# Patient Record
Sex: Female | Born: 1980
Health system: Southern US, Community
[De-identification: ages and names within clinical notes are randomized; demographics above are authoritative.]

## PROBLEM LIST (undated history)

## (undated) DIAGNOSIS — D509 Iron deficiency anemia, unspecified: Principal | ICD-10-CM

## (undated) DIAGNOSIS — R011 Cardiac murmur, unspecified: Secondary | ICD-10-CM

## (undated) DIAGNOSIS — F419 Anxiety disorder, unspecified: Secondary | ICD-10-CM

## (undated) DIAGNOSIS — M1990 Unspecified osteoarthritis, unspecified site: Secondary | ICD-10-CM

## (undated) DIAGNOSIS — I341 Nonrheumatic mitral (valve) prolapse: Secondary | ICD-10-CM

## (undated) DIAGNOSIS — D561 Beta thalassemia: Secondary | ICD-10-CM

## (undated) DIAGNOSIS — I10 Essential (primary) hypertension: Secondary | ICD-10-CM

## (undated) DIAGNOSIS — I340 Nonrheumatic mitral (valve) insufficiency: Secondary | ICD-10-CM

## (undated) DIAGNOSIS — D569 Thalassemia, unspecified: Secondary | ICD-10-CM

## (undated) DIAGNOSIS — N200 Calculus of kidney: Secondary | ICD-10-CM

## (undated) DIAGNOSIS — D649 Anemia, unspecified: Secondary | ICD-10-CM

## (undated) HISTORY — PX: KNEE SURGERY: SHX244

## (undated) HISTORY — PX: TONSILLECTOMY: SHX5217

## (undated) HISTORY — DX: Iron deficiency anemia, unspecified: D50.9

## (undated) HISTORY — DX: Cardiac murmur, unspecified: R01.1

## (undated) HISTORY — DX: Anemia, unspecified: D64.9

## (undated) HISTORY — DX: Calculus of kidney: N20.0

## (undated) HISTORY — DX: Nonrheumatic mitral (valve) prolapse: I34.1

## (undated) HISTORY — DX: Beta thalassemia: D56.1

## (undated) HISTORY — DX: Essential (primary) hypertension: I10

## (undated) HISTORY — DX: Unspecified osteoarthritis, unspecified site: M19.90

## (undated) HISTORY — PX: OVARIAN CYST REMOVAL: SHX89

## (undated) HISTORY — PX: TUBAL LIGATION: SHX77

## (undated) HISTORY — DX: Nonrheumatic mitral (valve) insufficiency: I34.0

## (undated) HISTORY — DX: Thalassemia, unspecified: D56.9

## (undated) HISTORY — DX: Anxiety disorder, unspecified: F41.9

---

## 1999-10-05 ENCOUNTER — Inpatient Hospital Stay (HOSPITAL_COMMUNITY): Admission: AD | Admit: 1999-10-05 | Discharge: 1999-10-07 | Payer: Self-pay | Admitting: *Deleted

## 1999-10-06 ENCOUNTER — Encounter: Payer: Self-pay | Admitting: *Deleted

## 1999-10-10 ENCOUNTER — Encounter: Payer: Self-pay | Admitting: *Deleted

## 1999-10-10 ENCOUNTER — Ambulatory Visit (HOSPITAL_COMMUNITY): Admission: RE | Admit: 1999-10-10 | Discharge: 1999-10-10 | Payer: Self-pay | Admitting: *Deleted

## 1999-10-11 ENCOUNTER — Encounter: Admission: RE | Admit: 1999-10-11 | Discharge: 1999-10-11 | Payer: Self-pay | Admitting: Obstetrics

## 1999-10-19 ENCOUNTER — Encounter: Admission: RE | Admit: 1999-10-19 | Discharge: 1999-10-19 | Payer: Self-pay | Admitting: Obstetrics

## 1999-10-26 ENCOUNTER — Encounter: Admission: RE | Admit: 1999-10-26 | Discharge: 1999-10-26 | Payer: Self-pay | Admitting: Obstetrics

## 1999-10-31 ENCOUNTER — Inpatient Hospital Stay (HOSPITAL_COMMUNITY): Admission: AD | Admit: 1999-10-31 | Discharge: 1999-10-31 | Payer: Self-pay | Admitting: Obstetrics

## 1999-11-01 ENCOUNTER — Encounter: Payer: Self-pay | Admitting: *Deleted

## 1999-11-01 ENCOUNTER — Inpatient Hospital Stay (HOSPITAL_COMMUNITY): Admission: AD | Admit: 1999-11-01 | Discharge: 1999-11-04 | Payer: Self-pay | Admitting: *Deleted

## 1999-11-09 ENCOUNTER — Encounter: Admission: RE | Admit: 1999-11-09 | Discharge: 1999-11-09 | Payer: Self-pay | Admitting: Obstetrics

## 1999-11-16 ENCOUNTER — Encounter: Admission: RE | Admit: 1999-11-16 | Discharge: 1999-11-16 | Payer: Self-pay | Admitting: Obstetrics

## 1999-11-23 ENCOUNTER — Encounter: Admission: RE | Admit: 1999-11-23 | Discharge: 1999-11-23 | Payer: Self-pay | Admitting: Obstetrics

## 1999-11-30 ENCOUNTER — Encounter: Admission: RE | Admit: 1999-11-30 | Discharge: 1999-11-30 | Payer: Self-pay | Admitting: Obstetrics

## 1999-12-01 ENCOUNTER — Inpatient Hospital Stay (HOSPITAL_COMMUNITY): Admission: AD | Admit: 1999-12-01 | Discharge: 1999-12-03 | Payer: Self-pay | Admitting: *Deleted

## 2000-08-13 ENCOUNTER — Encounter: Payer: Self-pay | Admitting: Emergency Medicine

## 2000-08-13 ENCOUNTER — Emergency Department (HOSPITAL_COMMUNITY): Admission: EM | Admit: 2000-08-13 | Discharge: 2000-08-14 | Payer: Self-pay | Admitting: Emergency Medicine

## 2000-08-14 ENCOUNTER — Encounter: Payer: Self-pay | Admitting: Emergency Medicine

## 2000-10-26 ENCOUNTER — Encounter: Payer: Self-pay | Admitting: Emergency Medicine

## 2000-10-26 ENCOUNTER — Emergency Department (HOSPITAL_COMMUNITY): Admission: EM | Admit: 2000-10-26 | Discharge: 2000-10-26 | Payer: Self-pay | Admitting: Emergency Medicine

## 2000-10-29 ENCOUNTER — Ambulatory Visit (HOSPITAL_BASED_OUTPATIENT_CLINIC_OR_DEPARTMENT_OTHER): Admission: RE | Admit: 2000-10-29 | Discharge: 2000-10-30 | Payer: Self-pay | Admitting: Orthopedic Surgery

## 2001-06-25 ENCOUNTER — Encounter: Admission: RE | Admit: 2001-06-25 | Discharge: 2001-08-11 | Payer: Self-pay | Admitting: Orthopedic Surgery

## 2002-05-09 ENCOUNTER — Emergency Department (HOSPITAL_COMMUNITY): Admission: EM | Admit: 2002-05-09 | Discharge: 2002-05-09 | Payer: Self-pay | Admitting: Emergency Medicine

## 2002-08-06 ENCOUNTER — Emergency Department (HOSPITAL_COMMUNITY): Admission: EM | Admit: 2002-08-06 | Discharge: 2002-08-06 | Payer: Self-pay | Admitting: *Deleted

## 2002-09-11 ENCOUNTER — Other Ambulatory Visit: Admission: RE | Admit: 2002-09-11 | Discharge: 2002-09-11 | Payer: Self-pay | Admitting: Oncology

## 2002-09-23 ENCOUNTER — Encounter: Payer: Self-pay | Admitting: Oncology

## 2002-09-23 ENCOUNTER — Observation Stay (HOSPITAL_COMMUNITY): Admission: EM | Admit: 2002-09-23 | Discharge: 2002-09-24 | Payer: Self-pay | Admitting: Oncology

## 2002-09-24 ENCOUNTER — Encounter: Payer: Self-pay | Admitting: Hematology and Oncology

## 2002-11-05 ENCOUNTER — Encounter: Payer: Self-pay | Admitting: Emergency Medicine

## 2002-11-05 ENCOUNTER — Inpatient Hospital Stay (HOSPITAL_COMMUNITY): Admission: EM | Admit: 2002-11-05 | Discharge: 2002-11-05 | Payer: Self-pay | Admitting: Emergency Medicine

## 2002-11-06 ENCOUNTER — Inpatient Hospital Stay (HOSPITAL_COMMUNITY): Admission: EM | Admit: 2002-11-06 | Discharge: 2002-11-08 | Payer: Self-pay | Admitting: Emergency Medicine

## 2002-11-06 ENCOUNTER — Encounter: Payer: Self-pay | Admitting: Internal Medicine

## 2002-12-04 ENCOUNTER — Ambulatory Visit (HOSPITAL_COMMUNITY): Admission: RE | Admit: 2002-12-04 | Discharge: 2002-12-04 | Payer: Self-pay | Admitting: Obstetrics & Gynecology

## 2002-12-04 ENCOUNTER — Encounter: Payer: Self-pay | Admitting: Obstetrics & Gynecology

## 2003-10-18 ENCOUNTER — Emergency Department (HOSPITAL_COMMUNITY): Admission: EM | Admit: 2003-10-18 | Discharge: 2003-10-18 | Payer: Self-pay | Admitting: Emergency Medicine

## 2004-06-23 ENCOUNTER — Emergency Department (HOSPITAL_COMMUNITY): Admission: EM | Admit: 2004-06-23 | Discharge: 2004-06-24 | Payer: Self-pay | Admitting: Emergency Medicine

## 2004-07-13 ENCOUNTER — Ambulatory Visit (HOSPITAL_COMMUNITY): Admission: RE | Admit: 2004-07-13 | Discharge: 2004-07-13 | Payer: Self-pay | Admitting: Urology

## 2004-08-24 ENCOUNTER — Ambulatory Visit: Payer: Self-pay | Admitting: Oncology

## 2004-09-05 ENCOUNTER — Inpatient Hospital Stay (HOSPITAL_COMMUNITY): Admission: AD | Admit: 2004-09-05 | Discharge: 2004-09-05 | Payer: Self-pay | Admitting: Obstetrics

## 2004-10-07 ENCOUNTER — Inpatient Hospital Stay (HOSPITAL_COMMUNITY): Admission: AD | Admit: 2004-10-07 | Discharge: 2004-10-07 | Payer: Self-pay | Admitting: Obstetrics

## 2004-11-14 ENCOUNTER — Inpatient Hospital Stay (HOSPITAL_COMMUNITY): Admission: AD | Admit: 2004-11-14 | Discharge: 2004-11-18 | Payer: Self-pay | Admitting: Obstetrics

## 2004-12-02 ENCOUNTER — Inpatient Hospital Stay (HOSPITAL_COMMUNITY): Admission: AD | Admit: 2004-12-02 | Discharge: 2004-12-06 | Payer: Self-pay | Admitting: Obstetrics & Gynecology

## 2004-12-02 ENCOUNTER — Encounter (INDEPENDENT_AMBULATORY_CARE_PROVIDER_SITE_OTHER): Payer: Self-pay | Admitting: *Deleted

## 2005-01-05 ENCOUNTER — Ambulatory Visit: Payer: Self-pay | Admitting: Oncology

## 2005-02-18 ENCOUNTER — Emergency Department (HOSPITAL_COMMUNITY): Admission: EM | Admit: 2005-02-18 | Discharge: 2005-02-18 | Payer: Self-pay | Admitting: Emergency Medicine

## 2005-02-20 ENCOUNTER — Ambulatory Visit: Payer: Self-pay | Admitting: Oncology

## 2005-02-23 ENCOUNTER — Inpatient Hospital Stay (HOSPITAL_COMMUNITY): Admission: AD | Admit: 2005-02-23 | Discharge: 2005-02-25 | Payer: Self-pay | Admitting: Obstetrics

## 2005-02-23 ENCOUNTER — Ambulatory Visit: Payer: Self-pay | Admitting: *Deleted

## 2005-02-28 ENCOUNTER — Inpatient Hospital Stay (HOSPITAL_COMMUNITY): Admission: AD | Admit: 2005-02-28 | Discharge: 2005-03-01 | Payer: Self-pay | Admitting: Obstetrics & Gynecology

## 2005-03-06 ENCOUNTER — Inpatient Hospital Stay (HOSPITAL_COMMUNITY): Admission: AD | Admit: 2005-03-06 | Discharge: 2005-03-09 | Payer: Self-pay | Admitting: Obstetrics

## 2005-03-07 ENCOUNTER — Encounter (INDEPENDENT_AMBULATORY_CARE_PROVIDER_SITE_OTHER): Payer: Self-pay | Admitting: Specialist

## 2005-03-20 ENCOUNTER — Ambulatory Visit: Admission: RE | Admit: 2005-03-20 | Discharge: 2005-03-20 | Payer: Self-pay | Admitting: Obstetrics

## 2005-04-24 ENCOUNTER — Ambulatory Visit: Payer: Self-pay | Admitting: Oncology

## 2005-08-14 ENCOUNTER — Ambulatory Visit: Payer: Self-pay | Admitting: Oncology

## 2005-08-16 LAB — CBC WITH DIFFERENTIAL/PLATELET
Basophils Absolute: 0 10*3/uL (ref 0.0–0.1)
Eosinophils Absolute: 0.2 10*3/uL (ref 0.0–0.5)
HCT: 33.9 % — ABNORMAL LOW (ref 34.8–46.6)
HGB: 10.8 g/dL — ABNORMAL LOW (ref 11.6–15.9)
MCV: 70.3 fL — ABNORMAL LOW (ref 81.0–101.0)
NEUT#: 2.4 10*3/uL (ref 1.5–6.5)
RDW: 15.8 % — ABNORMAL HIGH (ref 11.3–14.5)
lymph#: 1.7 10*3/uL (ref 0.9–3.3)

## 2005-11-05 ENCOUNTER — Ambulatory Visit: Payer: Self-pay | Admitting: Oncology

## 2005-11-08 LAB — CBC WITH DIFFERENTIAL/PLATELET
BASO%: 0.7 % (ref 0.0–2.0)
Eosinophils Absolute: 0.2 10*3/uL (ref 0.0–0.5)
MCHC: 32.6 g/dL (ref 32.0–36.0)
MONO#: 0.5 10*3/uL (ref 0.1–0.9)
NEUT#: 2.6 10*3/uL (ref 1.5–6.5)
RBC: 5.19 10*6/uL (ref 3.70–5.32)
RDW: 14.3 % (ref 11.3–14.5)
WBC: 5.1 10*3/uL (ref 3.9–10.0)
lymph#: 1.9 10*3/uL (ref 0.9–3.3)

## 2005-11-09 LAB — COMPREHENSIVE METABOLIC PANEL
ALT: 13 U/L (ref 0–40)
Albumin: 4.8 g/dL (ref 3.5–5.2)
CO2: 27 mEq/L (ref 19–32)
Calcium: 9.4 mg/dL (ref 8.4–10.5)
Chloride: 103 mEq/L (ref 96–112)
Glucose, Bld: 67 mg/dL — ABNORMAL LOW (ref 70–99)
Potassium: 3.9 mEq/L (ref 3.5–5.3)
Sodium: 139 mEq/L (ref 135–145)
Total Bilirubin: 1 mg/dL (ref 0.3–1.2)
Total Protein: 8.3 g/dL (ref 6.0–8.3)

## 2005-11-09 LAB — FERRITIN: Ferritin: 28 ng/mL (ref 10–291)

## 2005-11-09 LAB — PREGNANCY, URINE: Preg Test, Ur: NEGATIVE

## 2006-07-02 ENCOUNTER — Encounter: Admission: RE | Admit: 2006-07-02 | Discharge: 2006-07-02 | Payer: Self-pay | Admitting: *Deleted

## 2006-07-21 ENCOUNTER — Emergency Department (HOSPITAL_COMMUNITY): Admission: EM | Admit: 2006-07-21 | Discharge: 2006-07-21 | Payer: Self-pay | Admitting: Emergency Medicine

## 2007-02-20 ENCOUNTER — Emergency Department (HOSPITAL_COMMUNITY): Admission: EM | Admit: 2007-02-20 | Discharge: 2007-02-20 | Payer: Self-pay | Admitting: Emergency Medicine

## 2007-03-03 ENCOUNTER — Encounter: Admission: RE | Admit: 2007-03-03 | Discharge: 2007-03-03 | Payer: Self-pay | Admitting: Family Medicine

## 2007-03-27 ENCOUNTER — Inpatient Hospital Stay (HOSPITAL_COMMUNITY): Admission: RE | Admit: 2007-03-27 | Discharge: 2007-03-28 | Payer: Self-pay | Admitting: Urology

## 2007-03-31 ENCOUNTER — Ambulatory Visit (HOSPITAL_COMMUNITY): Admission: RE | Admit: 2007-03-31 | Discharge: 2007-03-31 | Payer: Self-pay | Admitting: Urology

## 2007-11-02 ENCOUNTER — Emergency Department (HOSPITAL_COMMUNITY): Admission: EM | Admit: 2007-11-02 | Discharge: 2007-11-02 | Payer: Self-pay | Admitting: Family Medicine

## 2008-04-13 IMAGING — CR DG CHEST 2V
2 series · 2 of 2 positions shown · non-contrast
Comparison: 03/27/2007

CLINICAL DATA: 26-year-old female, right pleural effusion following attempted nephrostomy.  Followup exam. 
CHEST - 2 VIEW:

[w chest pa]
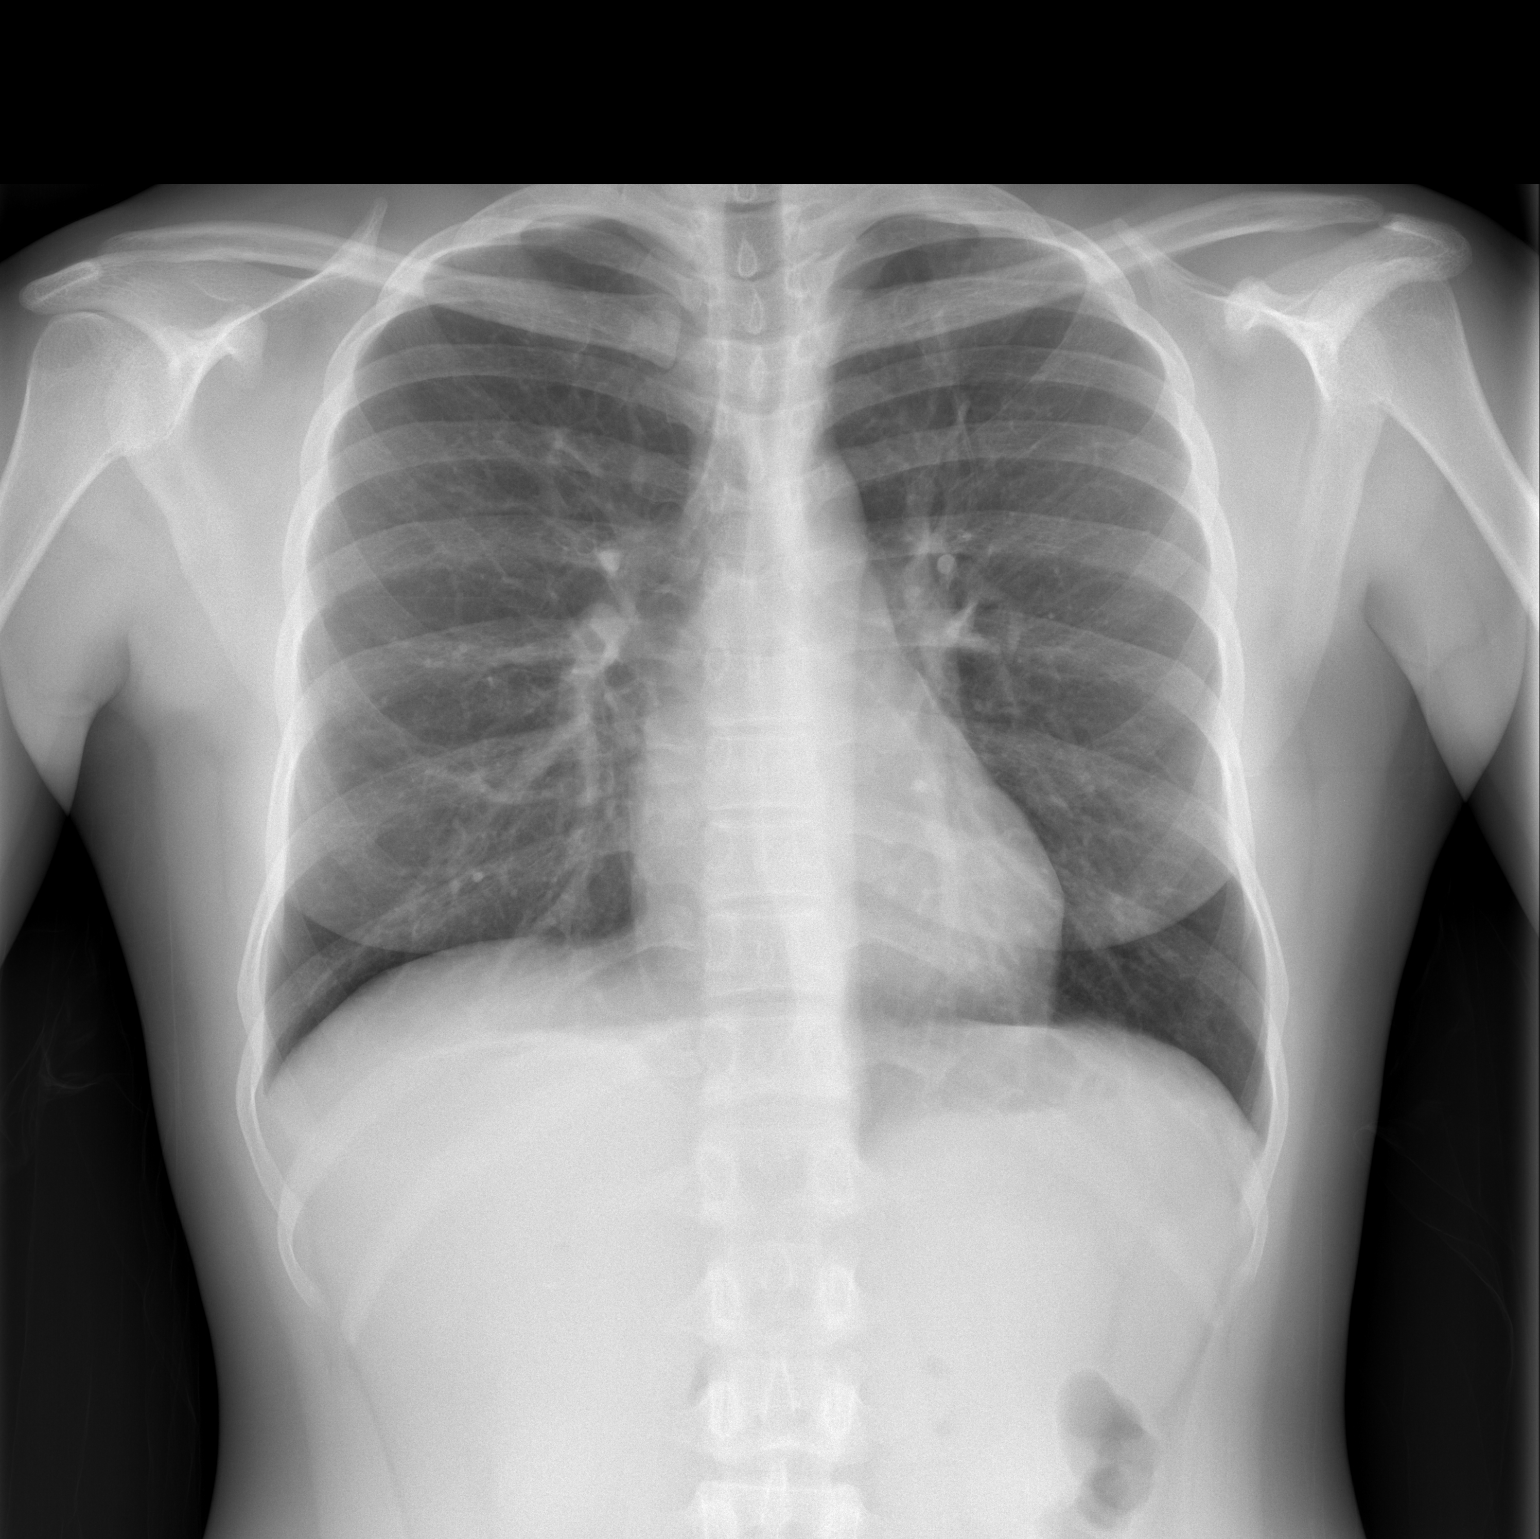

[w chest lat]
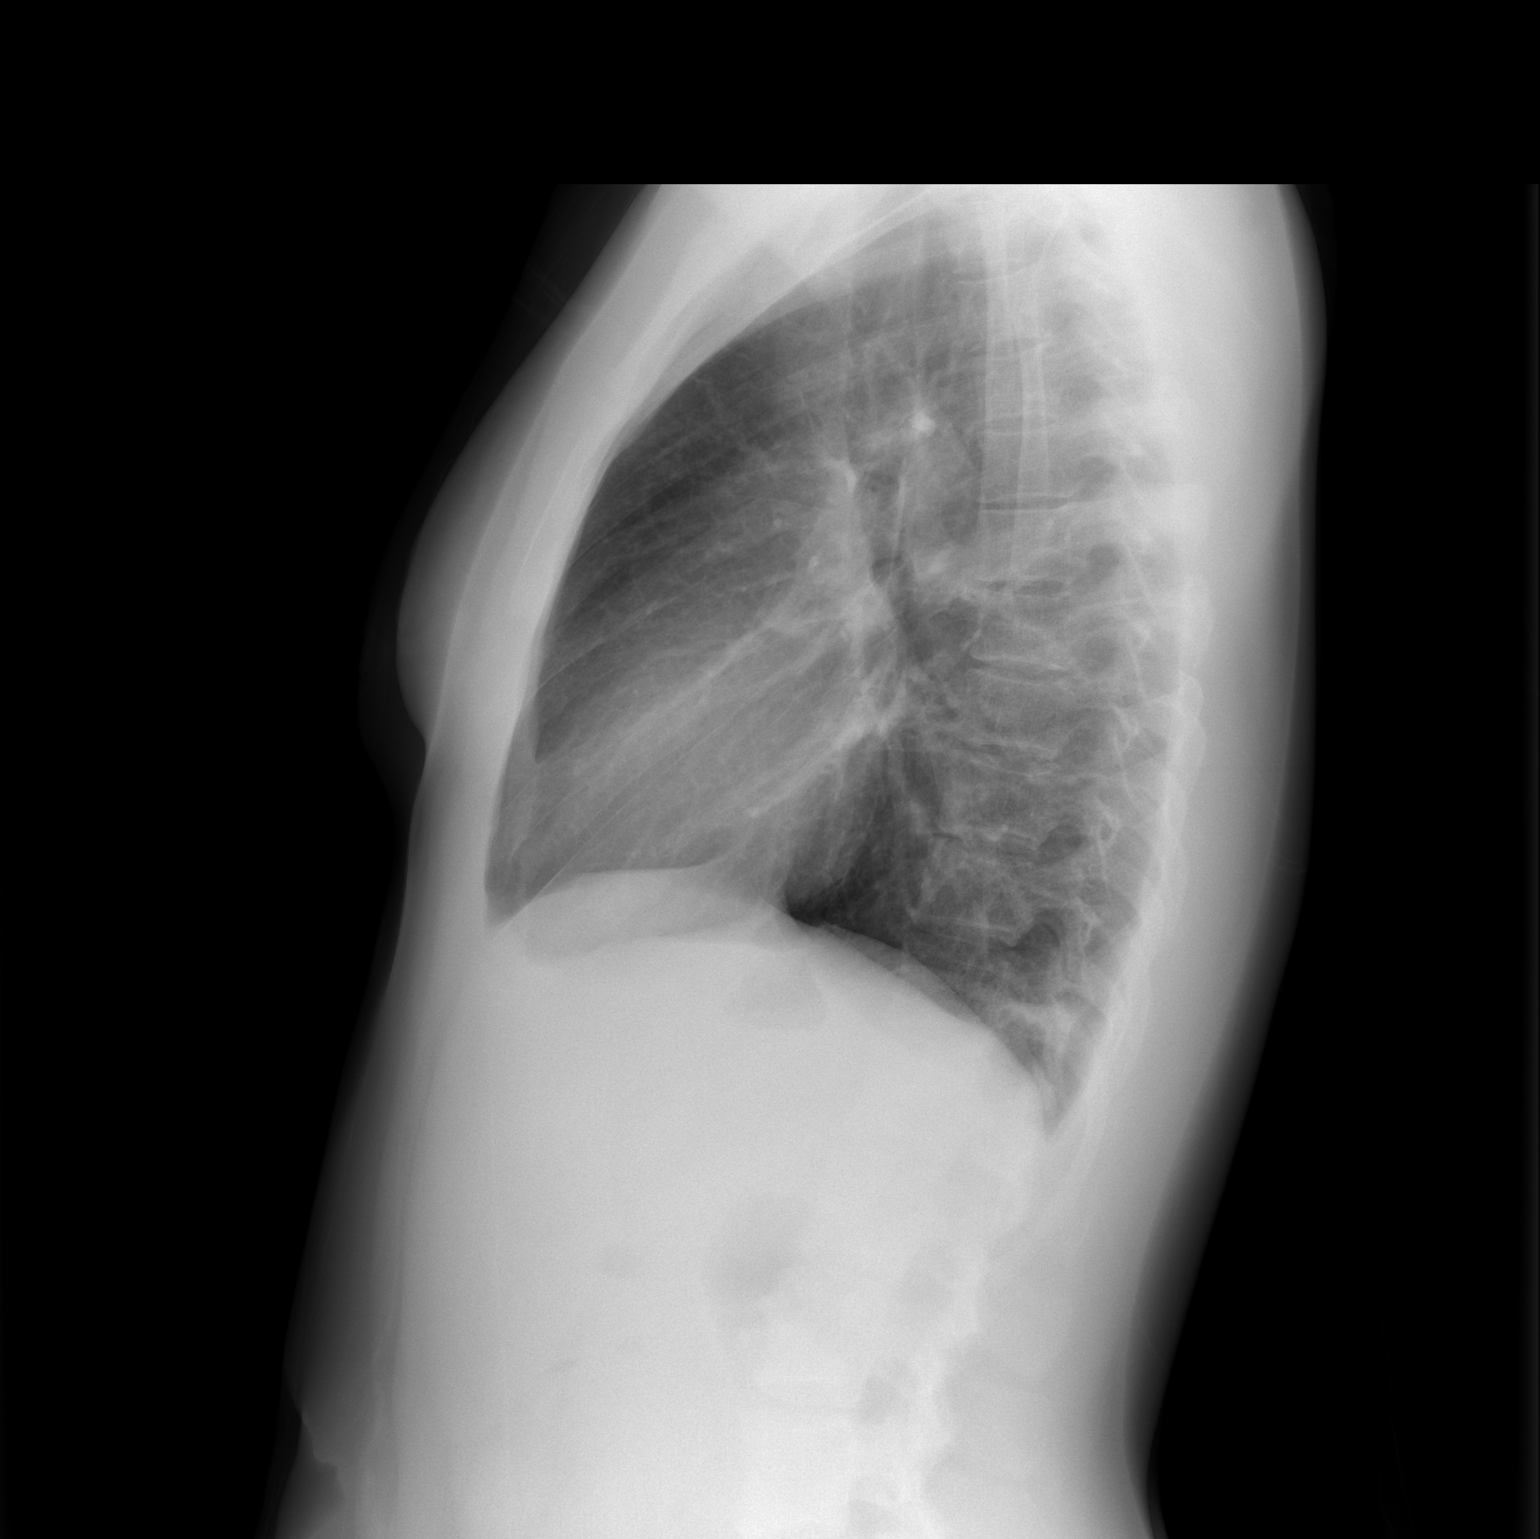

[2 of 2 positions shown; findings below may reference images not displayed]

FINDINGS: Right effusion has resolved. Minimal residual right base atelectasis.  No pneumothorax.  Normal heart size and vascularity.
IMPRESSION: Resolved right pleural effusion.  Residual right base atelectasis.

## 2008-04-21 ENCOUNTER — Emergency Department (HOSPITAL_COMMUNITY): Admission: EM | Admit: 2008-04-21 | Discharge: 2008-04-21 | Payer: Self-pay | Admitting: Emergency Medicine

## 2008-08-03 ENCOUNTER — Ambulatory Visit: Payer: Self-pay | Admitting: Oncology

## 2008-08-03 LAB — CBC WITH DIFFERENTIAL/PLATELET
Basophils Absolute: 0 10*3/uL (ref 0.0–0.1)
EOS%: 4.9 % (ref 0.0–7.0)
HGB: 9.7 g/dL — ABNORMAL LOW (ref 11.6–15.9)
MCH: 20.7 pg — ABNORMAL LOW (ref 25.1–34.0)
MCV: 65.6 fL — ABNORMAL LOW (ref 79.5–101.0)
MONO%: 9.7 % (ref 0.0–14.0)
NEUT#: 2.8 10*3/uL (ref 1.5–6.5)
RBC: 4.67 10*6/uL (ref 3.70–5.45)
RDW: 16.4 % — ABNORMAL HIGH (ref 11.2–14.5)
lymph#: 2 10*3/uL (ref 0.9–3.3)

## 2008-08-03 LAB — FERRITIN: Ferritin: 3 ng/mL — ABNORMAL LOW (ref 10–291)

## 2008-10-21 ENCOUNTER — Emergency Department (HOSPITAL_COMMUNITY): Admission: EM | Admit: 2008-10-21 | Discharge: 2008-10-21 | Payer: Self-pay | Admitting: Family Medicine

## 2008-12-24 ENCOUNTER — Emergency Department (HOSPITAL_COMMUNITY): Admission: EM | Admit: 2008-12-24 | Discharge: 2008-12-24 | Payer: Self-pay | Admitting: Family Medicine

## 2009-01-31 ENCOUNTER — Emergency Department (HOSPITAL_COMMUNITY): Admission: EM | Admit: 2009-01-31 | Discharge: 2009-01-31 | Payer: Self-pay | Admitting: Emergency Medicine

## 2009-01-31 ENCOUNTER — Emergency Department (HOSPITAL_COMMUNITY): Admission: EM | Admit: 2009-01-31 | Discharge: 2009-01-31 | Payer: Self-pay | Admitting: Family Medicine

## 2009-06-25 ENCOUNTER — Inpatient Hospital Stay (HOSPITAL_COMMUNITY): Admission: AD | Admit: 2009-06-25 | Discharge: 2009-06-25 | Payer: Self-pay | Admitting: Obstetrics & Gynecology

## 2009-06-25 ENCOUNTER — Ambulatory Visit: Payer: Self-pay | Admitting: Obstetrics & Gynecology

## 2009-07-20 ENCOUNTER — Ambulatory Visit: Payer: Self-pay | Admitting: Oncology

## 2009-07-20 LAB — FERRITIN: Ferritin: 17 ng/mL (ref 10–291)

## 2009-07-20 LAB — CBC WITH DIFFERENTIAL/PLATELET
Basophils Absolute: 0.1 10*3/uL (ref 0.0–0.1)
EOS%: 4 % (ref 0.0–7.0)
HCT: 27.3 % — ABNORMAL LOW (ref 34.8–46.6)
HGB: 8.1 g/dL — ABNORMAL LOW (ref 11.6–15.9)
MCH: 18.2 pg — ABNORMAL LOW (ref 25.1–34.0)
Platelets: 203 10*3/uL (ref 145–400)
RBC: 4.45 10*6/uL (ref 3.70–5.45)

## 2009-07-20 LAB — COMPREHENSIVE METABOLIC PANEL
ALT: 17 U/L (ref 0–35)
AST: 22 U/L (ref 0–37)
BUN: 10 mg/dL (ref 6–23)
CO2: 26 mEq/L (ref 19–32)
Calcium: 9 mg/dL (ref 8.4–10.5)
Chloride: 105 mEq/L (ref 96–112)
Potassium: 3.3 mEq/L — ABNORMAL LOW (ref 3.5–5.3)
Total Bilirubin: 0.8 mg/dL (ref 0.3–1.2)

## 2009-07-20 LAB — RETICULOCYTES
Immature Retic Fract: 17.5 % — ABNORMAL HIGH (ref 0.00–10.70)
RBC: 4.46 10*6/uL (ref 3.70–5.45)
Retic %: 1.3 % (ref 0.50–1.50)
Retic Ct Abs: 57.98 10*3/uL (ref 18.30–72.70)

## 2009-08-18 LAB — CBC & DIFF AND RETIC
Basophils Absolute: 0.1 10*3/uL (ref 0.0–0.1)
EOS%: 3.2 % (ref 0.0–7.0)
HCT: 31.4 % — ABNORMAL LOW (ref 34.8–46.6)
LYMPH%: 19.3 % (ref 14.0–49.7)
MCHC: 30.3 g/dL — ABNORMAL LOW (ref 31.5–36.0)
MCV: 64.5 fL — ABNORMAL LOW (ref 79.5–101.0)
NEUT%: 71.1 % (ref 38.4–76.8)
RBC: 4.87 10*6/uL (ref 3.70–5.45)
RDW: 24.4 % — ABNORMAL HIGH (ref 11.2–14.5)
Retic Ct Abs: 107.14 10*3/uL — ABNORMAL HIGH (ref 18.30–72.70)
lymph#: 1.6 10*3/uL (ref 0.9–3.3)

## 2009-09-16 ENCOUNTER — Ambulatory Visit: Payer: Self-pay | Admitting: Oncology

## 2009-10-21 ENCOUNTER — Ambulatory Visit: Payer: Self-pay | Admitting: Oncology

## 2009-11-03 ENCOUNTER — Other Ambulatory Visit: Payer: Self-pay | Admitting: Maternal and Fetal Medicine

## 2009-11-04 ENCOUNTER — Inpatient Hospital Stay (HOSPITAL_COMMUNITY): Admission: EM | Admit: 2009-11-04 | Discharge: 2009-11-06 | Payer: Self-pay | Admitting: Emergency Medicine

## 2009-11-04 ENCOUNTER — Ambulatory Visit: Payer: Self-pay | Admitting: Nurse Practitioner

## 2009-11-05 ENCOUNTER — Encounter (INDEPENDENT_AMBULATORY_CARE_PROVIDER_SITE_OTHER): Payer: Self-pay | Admitting: Internal Medicine

## 2009-11-21 ENCOUNTER — Ambulatory Visit: Payer: Self-pay | Admitting: Oncology

## 2010-01-13 ENCOUNTER — Ambulatory Visit: Payer: Self-pay | Admitting: Oncology

## 2010-01-17 LAB — CBC & DIFF AND RETIC
EOS%: 3.7 % (ref 0.0–7.0)
Eosinophils Absolute: 0.2 10*3/uL (ref 0.0–0.5)
HGB: 10.7 g/dL — ABNORMAL LOW (ref 11.6–15.9)
Immature Retic Fract: 6.2 % (ref 0.00–10.70)
MCH: 21.4 pg — ABNORMAL LOW (ref 25.1–34.0)
MCHC: 31.3 g/dL — ABNORMAL LOW (ref 31.5–36.0)
MCV: 68.4 fL — ABNORMAL LOW (ref 79.5–101.0)
MONO#: 0.6 10*3/uL (ref 0.1–0.9)
NEUT%: 58.4 % (ref 38.4–76.8)
Platelets: 253 10*3/uL (ref 145–400)
RBC: 5 10*6/uL (ref 3.70–5.45)
Retic Ct Abs: 57.5 10*3/uL (ref 18.30–72.70)

## 2010-02-15 ENCOUNTER — Ambulatory Visit: Payer: Self-pay | Admitting: Oncology

## 2010-02-15 LAB — COMPREHENSIVE METABOLIC PANEL
ALT: 20 U/L (ref 0–35)
Albumin: 3.9 g/dL (ref 3.5–5.2)
BUN: 7 mg/dL (ref 6–23)
Potassium: 3.4 mEq/L — ABNORMAL LOW (ref 3.5–5.3)
Sodium: 137 mEq/L (ref 135–145)

## 2010-02-15 LAB — CBC WITH DIFFERENTIAL/PLATELET
BASO%: 1 % (ref 0.0–2.0)
Eosinophils Absolute: 0.1 10*3/uL (ref 0.0–0.5)
HCT: 36.5 % (ref 34.8–46.6)
HGB: 11.6 g/dL (ref 11.6–15.9)
MCH: 22.1 pg — ABNORMAL LOW (ref 25.1–34.0)
MONO%: 6.7 % (ref 0.0–14.0)
NEUT#: 3.7 10*3/uL (ref 1.5–6.5)
NEUT%: 58.8 % (ref 38.4–76.8)
nRBC: 0 % (ref 0–0)

## 2010-03-20 ENCOUNTER — Ambulatory Visit: Payer: Self-pay | Admitting: Oncology

## 2010-05-07 ENCOUNTER — Encounter: Payer: Self-pay | Admitting: Interventional Radiology

## 2010-05-07 ENCOUNTER — Encounter: Payer: Self-pay | Admitting: Obstetrics & Gynecology

## 2010-07-01 LAB — COMPREHENSIVE METABOLIC PANEL
AST: 22 U/L (ref 0–37)
Albumin: 3.6 g/dL (ref 3.5–5.2)
Alkaline Phosphatase: 45 U/L (ref 39–117)
BUN: 6 mg/dL (ref 6–23)
CO2: 24 mEq/L (ref 19–32)
Chloride: 107 mEq/L (ref 96–112)
GFR calc non Af Amer: >60 mL/min (ref >60)
Glucose, Bld: 88 mg/dL (ref 70–99)
Total Bilirubin: 0.3 mg/dL (ref 0.3–1.2)

## 2010-07-01 LAB — CSF CELL COUNT WITH DIFFERENTIAL: Tube #: 2

## 2010-07-01 LAB — CBC
HCT: 32.4 % — ABNORMAL LOW (ref 36.0–46.0)
Hemoglobin: 10.2 g/dL — ABNORMAL LOW (ref 12.0–15.0)
MCHC: 31.5 g/dL (ref 30.0–36.0)
Platelets: 290 10*3/uL (ref 150–400)
Platelets: 292 10*3/uL (ref 150–400)
RBC: 4.6 MIL/uL (ref 3.87–5.11)
RDW: 17 % — ABNORMAL HIGH (ref 11.5–15.5)
RDW: 17.6 % — ABNORMAL HIGH (ref 11.5–15.5)
WBC: 8 10*3/uL (ref 4.0–10.5)

## 2010-07-01 LAB — PROTEIN AND GLUCOSE, CSF
Glucose, CSF: 94 mg/dL — ABNORMAL HIGH (ref 43–76)
Total  Protein, CSF: 18 mg/dL (ref 15–45)

## 2010-07-01 LAB — DIFFERENTIAL
Basophils Absolute: 0 10*3/uL (ref 0.0–0.1)
Basophils Relative: 1 % (ref 0–1)
Eosinophils Relative: 3 % (ref 0–5)
Lymphocytes Relative: 11 % — ABNORMAL LOW (ref 12–46)
Lymphocytes Relative: 34 % (ref 12–46)
Monocytes Absolute: 0.6 10*3/uL (ref 0.1–1.0)
Monocytes Relative: 7 % (ref 3–12)
Monocytes Relative: 8 % (ref 3–12)
Neutro Abs: 4.2 10*3/uL (ref 1.7–7.7)
Neutrophils Relative %: 54 % (ref 43–77)

## 2010-07-01 LAB — URINALYSIS, ROUTINE W REFLEX MICROSCOPIC
Bilirubin Urine: NEGATIVE
Glucose, UA: NEGATIVE mg/dL
Hgb urine dipstick: NEGATIVE
Ketones, ur: NEGATIVE mg/dL
pH: 7 (ref 5.0–8.0)

## 2010-07-01 LAB — CULTURE, BLOOD (ROUTINE X 2)

## 2010-07-01 LAB — RAPID URINE DRUG SCREEN, HOSP PERFORMED
Amphetamines: NOT DETECTED
Benzodiazepines: NOT DETECTED
Cocaine: NOT DETECTED

## 2010-07-01 LAB — HERPES SIMPLEX VIRUS(HSV) DNA BY PCR: HSV 1 DNA: NOT DETECTED

## 2010-07-01 LAB — BASIC METABOLIC PANEL
BUN: 6 mg/dL (ref 6–23)
GFR calc non Af Amer: >60 mL/min (ref >60)
Potassium: 4 mEq/L (ref 3.5–5.1)
Sodium: 140 mEq/L (ref 135–145)

## 2010-07-07 LAB — WET PREP, GENITAL
Clue Cells Wet Prep HPF POC: NONE SEEN
Trich, Wet Prep: NONE SEEN

## 2010-07-07 LAB — CBC
HCT: 29 % — ABNORMAL LOW (ref 36.0–46.0)
MCV: 62.7 fL — ABNORMAL LOW (ref 78.0–100.0)
Platelets: 231 10*3/uL (ref 150–400)
WBC: 9.5 10*3/uL (ref 4.0–10.5)

## 2010-07-07 LAB — URINALYSIS, ROUTINE W REFLEX MICROSCOPIC
Ketones, ur: NEGATIVE mg/dL
Leukocytes, UA: NEGATIVE
Nitrite: NEGATIVE
Specific Gravity, Urine: 1.01 (ref 1.005–1.030)
pH: 7.5 (ref 5.0–8.0)

## 2010-07-07 LAB — URINE MICROSCOPIC-ADD ON

## 2010-07-14 ENCOUNTER — Other Ambulatory Visit: Payer: Self-pay | Admitting: Obstetrics

## 2010-07-14 DIAGNOSIS — Z1231 Encounter for screening mammogram for malignant neoplasm of breast: Secondary | ICD-10-CM

## 2010-07-20 LAB — POCT URINALYSIS DIP (DEVICE)
Bilirubin Urine: NEGATIVE
Glucose, UA: NEGATIVE mg/dL
Nitrite: NEGATIVE

## 2010-07-20 LAB — DIFFERENTIAL
Basophils Absolute: 0.1 10*3/uL (ref 0.0–0.1)
Basophils Absolute: 0.1 10*3/uL (ref 0.0–0.1)
Basophils Relative: 1 % (ref 0–1)
Eosinophils Absolute: 0.1 10*3/uL (ref 0.0–0.7)
Eosinophils Relative: 1 % (ref 0–5)
Eosinophils Relative: 2 % (ref 0–5)
Lymphocytes Relative: 27 % (ref 12–46)
Lymphs Abs: 1.8 10*3/uL (ref 0.7–4.0)
Monocytes Absolute: 0.7 10*3/uL (ref 0.1–1.0)
Monocytes Absolute: 0.8 10*3/uL (ref 0.1–1.0)
Monocytes Relative: 11 % (ref 3–12)
Monocytes Relative: 11 % (ref 3–12)
Neutro Abs: 4.1 10*3/uL (ref 1.7–7.7)
Neutrophils Relative %: 59 % (ref 43–77)
Neutrophils Relative %: 60 % (ref 43–77)

## 2010-07-20 LAB — POCT I-STAT, CHEM 8
Hemoglobin: 9.5 g/dL — ABNORMAL LOW (ref 12.0–15.0)
Sodium: 138 mEq/L (ref 135–145)
TCO2: 22 mmol/L (ref 0–100)

## 2010-07-20 LAB — POCT PREGNANCY, URINE: Preg Test, Ur: NEGATIVE

## 2010-07-20 LAB — CBC
HCT: 26.4 % — ABNORMAL LOW (ref 36.0–46.0)
Hemoglobin: 8.2 g/dL — ABNORMAL LOW (ref 12.0–15.0)
Platelets: 208 10*3/uL (ref 150–400)
Platelets: 213 10*3/uL (ref 150–400)
RBC: 4.32 MIL/uL (ref 3.87–5.11)
WBC: 6.8 10*3/uL (ref 4.0–10.5)
WBC: 7.2 10*3/uL (ref 4.0–10.5)

## 2010-07-20 LAB — PREGNANCY, URINE: Preg Test, Ur: NEGATIVE

## 2010-07-21 LAB — POCT URINALYSIS DIP (DEVICE)
Hgb urine dipstick: NEGATIVE
Protein, ur: 30 mg/dL — AB
Specific Gravity, Urine: 1.025 (ref 1.005–1.030)
pH: 5.5 (ref 5.0–8.0)

## 2010-07-25 ENCOUNTER — Ambulatory Visit: Payer: Self-pay

## 2010-07-31 LAB — POCT URINALYSIS DIP (DEVICE)
Glucose, UA: NEGATIVE mg/dL
Nitrite: NEGATIVE
Specific Gravity, Urine: 1.02 (ref 1.005–1.030)

## 2010-07-31 LAB — WET PREP, GENITAL: Trich, Wet Prep: NONE SEEN

## 2010-07-31 LAB — GC/CHLAMYDIA PROBE AMP, GENITAL
Chlamydia, DNA Probe: NEGATIVE
GC Probe Amp, Genital: NEGATIVE

## 2010-07-31 LAB — URINE CULTURE

## 2010-08-07 ENCOUNTER — Encounter: Payer: Self-pay | Admitting: Internal Medicine

## 2010-08-07 ENCOUNTER — Other Ambulatory Visit (INDEPENDENT_AMBULATORY_CARE_PROVIDER_SITE_OTHER): Payer: Medicaid Other

## 2010-08-07 ENCOUNTER — Ambulatory Visit (INDEPENDENT_AMBULATORY_CARE_PROVIDER_SITE_OTHER): Payer: Medicaid Other | Admitting: Internal Medicine

## 2010-08-07 VITALS — BP 94/66 | HR 88 | Ht 65.0 in | Wt 162.0 lb

## 2010-08-07 DIAGNOSIS — K625 Hemorrhage of anus and rectum: Secondary | ICD-10-CM

## 2010-08-07 DIAGNOSIS — R1010 Upper abdominal pain, unspecified: Secondary | ICD-10-CM

## 2010-08-07 DIAGNOSIS — R112 Nausea with vomiting, unspecified: Secondary | ICD-10-CM

## 2010-08-07 DIAGNOSIS — R109 Unspecified abdominal pain: Secondary | ICD-10-CM

## 2010-08-07 LAB — LIPASE: Lipase: 17 U/L (ref 11.0–59.0)

## 2010-08-07 LAB — CBC WITH DIFFERENTIAL/PLATELET
Basophils Relative: 0.6 % (ref 0.0–3.0)
Eosinophils Absolute: 0.3 10*3/uL (ref 0.0–0.7)
HCT: 34.2 % — ABNORMAL LOW (ref 36.0–46.0)
Hemoglobin: 11 g/dL — ABNORMAL LOW (ref 12.0–15.0)
Lymphocytes Relative: 32.3 % (ref 12.0–46.0)
MCHC: 32.3 g/dL (ref 30.0–36.0)
Neutro Abs: 4.4 10*3/uL (ref 1.4–7.7)
RBC: 4.82 Mil/uL (ref 3.87–5.11)

## 2010-08-07 LAB — COMPREHENSIVE METABOLIC PANEL
AST: 23 U/L (ref 0–37)
BUN: 9 mg/dL (ref 6–23)
CO2: 24 mEq/L (ref 19–32)
Calcium: 8.7 mg/dL (ref 8.4–10.5)
Chloride: 102 mEq/L (ref 96–112)
Creatinine, Ser: 0.9 mg/dL (ref 0.4–1.2)
GFR: 96.86 mL/min (ref >60.00)

## 2010-08-07 NOTE — Progress Notes (Signed)
Subjective:    Patient ID: Andrea [name], female    DOB: 1980-10-13, 30 y.o.   MRN: [id number]  HPI 30-year-old [place] American woman with a history of upper abdominal pain that began around January 2012. [place], had some nausea and vomiting that was attributed to sea sickness. She was okay for several weeks. Then she developed a syndrome of nausea, vomiting and diarrhea. This lasted for several days. He had some improvement but has continued to have intermittent epigastric and upper abdominal pain quite severe at times and usually after eating but not always. She's had intermittent nausea and vomiting. She was sent to her gynecologist for evaluation because primary care physician thought she might have another ovarian cyst as in the past. She's had some loose stools but no frank diarrhea. The problems have persisted for several weeks if not months now. They seem to be intensifying. She stopped all of her medications including birth control, prenatal vitamin, Xanax and unknown antidepressant. She has had no radiographic investigation. It sounds like she's had some laboratory testing but I do not have those records. She did try some Pepto-Bismol but no other medications it sounds like. Her gynecologist, Dr. Clearance Coots, performed a pelvic exam but thought she should see a gastroenterologist. She has lost about 10 pounds of weight.  On 2 or 3 occasions she passed some bright red associated with somewhat explosive bowel movements.  She denies ever having problems like this before. Her appetite is now off. She feels bloated and gassy much of the time as well. She does have nocturnal symptoms at times. She has wondered if she does not have a gluten intolerance.  Past Medical History  Diagnosis Date  . Anemia   . Anxiety   . Arthritis   . Kidney stones   . Beta thalassemia major     dx 10 years ago, Dr. Darnelle Catalan  . Mitral regurgitation due to cusp prolapse    Past  Surgical History  Procedure Date  . Ovarian cyst removal     rupture  . Knee surgery     left x 2  . Tonsillectomy     reports that she quit smoking about 20 years ago. Her smoking use included Cigarettes. She has never used smokeless tobacco. She reports that she drinks about 7 ounces of alcohol per week. She reports that she does not use illicit drugs. family history includes Breast cancer in her mother; Diabetes in her father; and Hypertension in her father. Allergies  Allergen Reactions  . Stadol (Butorphanol Tartrate)     hallucinations  . Tributyl Phosphate     tributaline- hallucinations      Review of Systems Positive for allergies and sinus problems, joint pains, anxiety, fatigue, muscle pains, known heart murmur some menstrual pain she's had some menorrhagia as well. She also has headaches. All other systems are negative.    Objective:   Physical Exam .General: Well-developed, well-nourished and in no acute distress Vitals: Reviewed and listed above Eyes:anicteric. Mouth and posterior pharynx: normal.  Neck: supple w/o thyromegaly or mass.  Lungs: clear. Heart: S1S2, with 2/6 HSM and click at LSB Abdomen: soft, tender in upper abdomen without guarding or rebound., no hepatosplenomegaly, hernia, or mass and BS+.  Rectal: female staff present - brown heme negative stool and no mass.  Lymphatics: no cervical, [place] nodes. Extremities:  no edema Skin no rash. Neuro: nonfocal. A&O x 3.  Psych: appropriate mood and  affect.  Assessment & Plan:   #1 upper abdominal pain for several months intermittent, mostly postprandial. #2 nausea and vomiting #3 rectal bleeding  This seems to have started after her gastroenteritis. She has thought she's had irritable bowel syndrome with alternating bowel habits over the years. It could be an exacerbation of IBS and functional disturbance. However inflammatory bowel disease, peptic ulcer disease, gastritis are all  possibilities. Given the constellation of signs and symptoms upper GI endoscopy and flexible sigmoidoscopy will be undertaken. Risks benefits and indications are explained she understands and agrees to proceed.  Also evaluate with CBC and comprehensive metabolic panel as well as amylase and lipase looking for other clues to the cause of her problems. Further plans pending these results and clinical course.

## 2010-08-07 NOTE — Patient Instructions (Signed)
Please go to our basement lab today for labs. You have been scheduled for a Endo and a Flex instructions have been provided.

## 2010-08-08 ENCOUNTER — Encounter: Payer: Medicaid Other | Admitting: Internal Medicine

## 2010-08-10 ENCOUNTER — Encounter: Payer: Medicaid Other | Admitting: Internal Medicine

## 2010-08-17 ENCOUNTER — Encounter: Payer: Self-pay | Admitting: Internal Medicine

## 2010-08-18 ENCOUNTER — Encounter: Payer: Self-pay | Admitting: Internal Medicine

## 2010-08-18 ENCOUNTER — Ambulatory Visit (AMBULATORY_SURGERY_CENTER): Payer: Medicaid Other | Admitting: Internal Medicine

## 2010-08-18 VITALS — BP 111/73 | HR 76 | Temp 97.8°F | Resp 20 | Ht 65.0 in | Wt 160.0 lb

## 2010-08-18 DIAGNOSIS — K2940 Chronic atrophic gastritis without bleeding: Secondary | ICD-10-CM

## 2010-08-18 DIAGNOSIS — K625 Hemorrhage of anus and rectum: Secondary | ICD-10-CM

## 2010-08-18 DIAGNOSIS — R109 Unspecified abdominal pain: Secondary | ICD-10-CM

## 2010-08-18 DIAGNOSIS — K648 Other hemorrhoids: Secondary | ICD-10-CM

## 2010-08-18 DIAGNOSIS — R933 Abnormal findings on diagnostic imaging of other parts of digestive tract: Secondary | ICD-10-CM

## 2010-08-18 DIAGNOSIS — R112 Nausea with vomiting, unspecified: Secondary | ICD-10-CM

## 2010-08-18 MED ORDER — SODIUM CHLORIDE 0.9 % IV SOLN: 500.0000 mL | INTRAVENOUS | Status: DC

## 2010-08-18 MED ORDER — HYOSCYAMINE SULFATE 0.125 MG SL SUBL: 0.1250 mg | SUBLINGUAL_TABLET | SUBLINGUAL | Status: DC | PRN

## 2010-08-18 NOTE — Patient Instructions (Addendum)
Some possible stomach inflammation was seen and you have small hemorrhoids. You most likely have IBS. Hyoscyamine was prescribed. Please go to your pharmacy by tomorrow to get this. Discuss restarting your other medications with the physicians or providers that prescribed them. This office will contact you about follow-up plans after the biopsy results return  Irritable Bowel Syndrome (Spastic Colon) Irritable Bowel Syndrome (IBS) is caused by a disturbance of normal bowel function. Other terms used are spastic colon, mucous colitis, and irritable colon. It does not require surgery, nor does it lead to cancer. There is no cure for IBS. But with proper diet, stress reduction, and medication, you will find that your problems (symptoms) will gradually disappear or improve. IBS is a common digestive disorder. It usually appears in late adolescence or early adulthood. Women develop it twice as often as men. CAUSES After food has been digested and absorbed in the small intestine, waste material is moved into the colon (large intestine). In the colon, water and salts are absorbed from the undigested products coming from the small intestine. The remaining residue, or fecal material, is held for elimination. Under normal circumstances, gentle, rhythmic contractions on the bowel walls push the fecal material along the colon towards the rectum. In IBS, however, these contractions are irregular and poorly coordinated. The fecal material is either retained too long, resulting in constipation, or expelled too soon, producing diarrhea. SYMPTOMS  The most common symptom of IBS is pain. It is typically in the lower left side of the belly (abdomen). But it may occur anywhere in the abdomen. It can be felt as heartburn, backache, or even as a dull pain in the arms or shoulders. The pain comes from excessive bowel-muscle spasms and from the buildup of gas and fecal material in the colon. This pain:  Can range from sharp  belly (abdominal) cramps to a dull, continuous ache.   Usually worsens soon after eating.   Is typically relieved by having a bowel movement or passing gas.  Abdominal pain is usually accompanied by constipation. But it may also produce diarrhea. The diarrhea typically occurs right after a meal or upon arising in the morning. The stools are typically soft and watery. They are often flecked with secretions (mucus). Other symptoms of IBS include:  Bloating.  Loss of appetite.   Heartburn.  Feeling sick to your stomach  (nausea).   Belching  Vomiting   Gas.  IBS may also cause a number of symptoms that are unrelated to the digestive system:  Fatigue.  Headaches.   Anxiety  Shortness of breath   Difficulty in concentrating.  Dizziness.   These symptoms tend to come and go. DIAGNOSIS The symptoms of IBS closely mimic the symptoms of other, more serious digestive disorders. So your caregiver may wish to perform a variety of additional tests to exclude these disorders. He/she wants to be certain of learning what is wrong (diagnosis). The nature and purpose of each test will be explained to you. TREATMENT A number of medications are available to help correct bowel function and/or relieve bowel spasms and abdominal pain. Among the drugs available are:  Mild, non-irritating laxatives for severe constipation and to help restore normal bowel habits.   Specific anti-diarrheal medications to treat severe or prolonged diarrhea.   Anti-spasmodic agents to relieve intestinal cramps.   Your caregiver may also decide to treat you with a mild tranquilizer or sedative during unusually stressful periods in your life.  The important thing to remember is that  if any drug is prescribed for you, make sure that you take it exactly as directed. Make sure that your caregiver knows how well it worked for you. HOME CARE INSTRUCTIONS   Avoid foods that are high in fat or oils. Some examples WUJ:[id number]  cream, butter, frankfurters, sausage, and other fatty meats.   Avoid foods that have a laxative effect, such as fruit, fruit juice, and dairy products.   Cut out carbonated drinks, chewing gum, and "gassy" foods, such as beans and cabbage. This may help relieve bloating and belching.   Bran taken with plenty of liquids may help relieve constipation.   Keep track of what foods seem to trigger your symptoms.   Avoid emotionally charged situations or circumstances that produce anxiety.   Start or continue exercising.   Get plenty of rest and sleep.  MAKE SURE YOU:   Understand these instructions.   Will watch your condition.   Will get help right away if you are not doing well or get worse.  Document Released: 04/02/2005 Document Re-Released: 08/19/2008 [facility] Patient Information 2011 [place], [place].Marland Kitchen DISCHARGED INSTRUCTIONS GIVEN WITH VERBAL UNDERSTANDING.

## 2010-08-21 ENCOUNTER — Telehealth: Payer: Self-pay

## 2010-08-21 NOTE — Telephone Encounter (Signed)

## 2010-08-23 ENCOUNTER — Other Ambulatory Visit: Payer: Self-pay | Admitting: Internal Medicine

## 2010-08-23 DIAGNOSIS — R109 Unspecified abdominal pain: Secondary | ICD-10-CM

## 2010-08-23 MED ORDER — HYOSCYAMINE SULFATE 0.125 MG SL SUBL: 0.1250 mg | SUBLINGUAL_TABLET | SUBLINGUAL | Status: AC | PRN

## 2010-08-23 NOTE — Telephone Encounter (Signed)
rx sent patient advised.

## 2010-08-24 NOTE — Progress Notes (Signed)
Quick Note:  Office  Please call her and tell her only minimal gastritis on stomach bxs - not necessarily a problem My dx is IBS which will hopefully get better (it started after a gastroenteritis) She is to use hyoscyamine and follow-up as needed - hopefully that has helped  [facility] - no recall or letter ______

## 2010-08-25 ENCOUNTER — Ambulatory Visit: Payer: Medicaid Other

## 2010-09-01 NOTE — Discharge Summary (Signed)
[name], [name]           ACCOUNT NO.:  [id number]   MEDICAL RECORD NO.:  [id number]          PATIENT TYPE:  INP   LOCATION:  9114                          [facility]   PHYSICIAN:  Charles A. Clearance Coots, M.D.DATE OF BIRTH:  05/15/1980   DATE OF ADMISSION:  03/06/2005  DATE OF DISCHARGE:  03/09/2005                                 DISCHARGE SUMMARY   ADMISSION DIAGNOSES:  1.  Thirty seven weeks gestation.  2.  Premature rupture of membranes.   DISCHARGE DIAGNOSES:  1.  Thirty seven weeks gestation.  2.  Premature rupture of membranes.  3.  Status post normal spontaneous vaginal delivery viable female on March 07, 2005 at 0820 hours, Apgars of 8 at one minute, 9 at five minutes,      weight of 2925 g, length of 49.5 cm.  Mother and infant discharged home      in good condition.   REASON FOR ADMISSION:  A 30 year old G87, P1-1-0-2 black female estimated  date of confinement of March 25, 2005 presented to the office with  complaint of large gush of clear fluid.  On gross examination there was  clear fluid coming from the vaginal vault in a moderate amount.  On testing  the fluid was Nitrazine and fern positive.  The patient was sent to labor  and delivery for admission.   PAST MEDICAL HISTORY:  Past medical history is significant for thalassemia  which has been stable during pregnancy; last hemoglobin was 8.3  approximately one week prior to admission.   OBSTETRICAL HISTORY:  Obstetrical history is significant for a right ovarian  cystectomy during this pregnancy at approximately [redacted] weeks gestation.  Patient did well after surgery.   PAST SURGICAL HISTORY:  Surgery per history of present illness.   ILLNESSES:  Thalassemia, mitral valve prolapse.   MEDICATION:  Prenatal vitamins.   ALLERGIES:  STADOL and TERBUTALINE both cause hallucinations.   SOCIAL HISTORY:  Single.  Negative tobacco, alcohol, or recreational drug  use.   PHYSICAL EXAMINATION:   Well-nourished, well-developed black female in no  acute distress.  Temperature 98.3, pulse 115, respiratory rate 20, blood  pressure 102/60.  Lungs are clear to auscultation bilaterally.  Heart  regular rate and rhythm.  Abdomen gravid, nontender.  Digital cervical exam  was omitted.  External fetal monitor revealed fetal heart rate of 150-160  beats per minute and reactive.  Informal ultrasound was done which revealed  vertex presentation.   IMPRESSION:  Thirty seven weeks gestation.  Premature rupture of membranes.   PLAN:  Admit.  Pitocin augmentation as needed.   ADMITTING LABORATORY VALUES:  Hemoglobin 9.1, hematocrit 29.2, white blood  cell count 12,000, platelets 214,000.  RPR was nonreactive.   HOSPITAL COURSE:  Patient was admitted and progressed to normal spontaneous  vaginal delivery without complications.  Patient desired permanent  sterilization and was taken to the operating room approximately 6 hours  postpartum for tubal sterilization.  Bilateral partial salpingectomy was  performed without complications.  The remainder of the postpartum course was  uncomplicated and patient was discharged home on postpartum  day #1 in good  condition.   DISCHARGE LABORATORY VALUES:  Hemoglobin 7.9, hematocrit 25.2, white blood  cell count 11,800, platelets 187,000.   DISCHARGE DISPOSITION:  1.  Medications:  Tylox and ibuprofen was prescribed for pain.  Diflucan was      prescribed for a yeast infection.  Patient is to continue prenatal      vitamins.  2.  Routine written instructions were given for discharge after vaginal      delivery.  3.  Patient is to call the office for a followup appointment in 6 weeks.      Charles A. Clearance Coots, M.D.  Electronically Signed     CAH/MEDQ  D:  03/09/2005  T:  03/09/2005  Job:  [id number]

## 2010-09-01 NOTE — Discharge Summary (Signed)
[facility]  Patient:    Andrea [name], Andrea [name]                    MRN: [id number] Adm. Date:  [id number] Disc. Date: [id number] Attending:  Osvaldo Human                           Discharge Summary  HISTORY OF PRESENT ILLNESS:   A 30 year old gravida 2, para 1-0-0-1 at [redacted] weeks gestation.  Patient has preterm contractions and was asked to be hospitalized last night, but refused and returns today.  PHYSICAL EXAMINATION  VITAL SIGNS:                  Temperature 97.1, blood pressure 95/66, pulse 92, fetal heart rate 130s.  CHEST:                        Clear.  ABDOMEN:                      Gravid.                                Patient was admitted.  Bed rest.  Begun on p.o. Procardia.  Patient continued bed rest.  Patient was seen by the hematologists due to anemia, thrombocytopenia.  Impression was probable thalassemia and thrombocytopenia of uncertain etiology, possibly autoimmune.  The consultant recommended transfusion to get her hemoglobin greater than 9, continue iron. Patient was transfused and was felt to be ready for discharge on November 04, 1999.  Patient was discharged on 30 XL Procardia.  Patient is to have followup with the hematologist. DD:  10/21/00 TD:  10/21/00 Job: [id number]

## 2010-09-01 NOTE — Discharge Summary (Signed)
Andrea [name], Andrea [name]           ACCOUNT NO.:  [id number]   MEDICAL RECORD NO.:  [id number]          PATIENT TYPE:  INP   LOCATION:  9164                          [facility]   PHYSICIAN:  Charles A. Clearance Coots, M.D.DATE OF BIRTH:  1981-01-12   DATE OF ADMISSION:  02/23/2005  DATE OF DISCHARGE:  02/25/2005                                 DISCHARGE SUMMARY   ADMITTING DIAGNOSES:  1.  Thirty-four weeks gestation.  2.  Preterm uterine contractions.   DISCHARGE DIAGNOSES:  1.  Thirty-four weeks gestation.  2.  Preterm uterine contractions.   Dictation ends here.      Charles A. Clearance Coots, M.D.  Electronically Signed     CAH/MEDQ  D:  03/28/2005  T:  03/28/2005  Job:  [id number]

## 2010-09-01 NOTE — Op Note (Signed)
[place]  Patient:    Andrea [name], Andrea [name]                    MRN: [id number] Proc. Date: 10/29/00 Adm. Date:  [id number] Attending:  Twana First                           Operative Report  PREOPERATIVE DIAGNOSES: 1. 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 2. 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  POSTOPERATIVE DIAGNOSES: 1. 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 2. 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  PROCEDURE: 1. Left knee examination under anesthesia followed by arthroscopic    chondroplasty, lateral compartment and patellofemoral joint. 2. Left knee medial patellofemoral ligament repair/reefing.  SURGEON:  Elana Alm. Thurston Hole, M.D.  ASSISTANT:  Julien Girt, P.A.  ANESTHESIA:  General.  OPERATIVE TIME:  Fifty minutes.  COMPLICATIONS:  None.  INDICATION FOR PROCEDURE:  Andrea [name] is a 30 year old woman who has had painful recurrent patellar dislocations and instability with chondromalacia who has failed conservative care and is now to undergo arthroscopy and medial patellofemoral ligament repair and patellofemoral reefing.  DESCRIPTION OF PROCEDURE:  Andrea [name] was brought to the operating room on October 29, 2000 and placed on the operative table in supine position.  After an adequate level of general anesthesia was obtained, her left knee was examined under anesthesia.  She had frank subluxability of the left patella with almost dislocatability compared to none on the right.  After this was done, the knee was tested for other ligamentous instabilities, which she did not have, with normal ligamentous exam, full range of motion noted.  Left leg was prepped with sterile Betadine and draped using sterile technique.  She received Ancef 1 g IV preoperatively for prophylaxis.  The leg was exsanguinated and a  thigh tourniquet elevated to 350 mm.  Initially, through an inferolateral portal, the arthroscope with a pump attachment was placed and through an inferomedial portal, an arthroscopic probe was placed.  On initial inspection of medial compartment, articular cartilage, medial femoral condyle and medial tibial plateau showed 25% grade 3 chondromalacia which was debrided.  Medial meniscus was probed and this was found to be normal.  Intercondylar notch inspected; anterior and posterior cruciate ligament were normal.  Lateral compartment inspected; she had severe grade 3 chondromalacia over 75% of her lateral femoral condyle which was thoroughly debrided to a stable but thin base and a stable rim.  She had 25% grade 3 changes on the lateral tibial plateau which were debrided.  The lateral meniscus showed significant superficial fibrillation but no frank tearing.  Patellofemoral joint showed grade 3 chondromalacia over the entire medial patellar facet which was debrided.  The lateral patellar facet showed only mild grade 2 changes. Femoral groove showed only mild grade 1 to 2 changes.  There was obvious lateral patellar subluxation and the patella did not seat well in the femoral groove at all, which was very shallow through a full range of motion.  The medial and lateral gutters showed mild synovitis but no other pathology. After this was done, the arthroscopic instruments were removed and 4-cm medial incision was made over the medial femoral condyle.  The underlying subcutaneous tissues were incised in line with the skin incision.  The retinaculum over the medial patellofemoral ligament was incised revealing an underlying very attenuated patellofemoral ligament which was then incised longitudinally and  then a reefing procedure using #1 Panacryl suture, multiple mattress sutures to place through the bone and reefing the medial patellofemoral ligament back down, tightening this back down to the  medial femoral condyle; four of these mattress sutures were placed.  After this was done, then the arthroscope was placed back in the joint.  There was found to be significant improvement in patellofemoral tracking and tightness of the lax medial structures.  After this was done, then the retinaculum over the medial patellofemoral ligament repair was closed also with #1 Panacryl suture.  After this was done, it was felt that there was excellent restoration of normal patella tightness, mobility and normal patellar tracking.  The wound was then irrigated and then closed using 2-0 Vicryl in the subcutaneous tissues and 3-0 Prolene in the subcuticular layer.  Steri-Strips were applied.  Sterile dressings were applied after the knee was injected with 0.25% Marcaine with epinephrine.  The tourniquet was released, a long knee immobilizer was placed and then the patient was awakened and taken to the recovery room in stable condition.  FOLLOWUP CARE:  Andrea [name] will be followed overnight at [facility] for IV pain control and neurovascular monitoring.  Discharge tomorrow on Percocet and Naprosyn.  See her back in the office in a week for sutures out and followup. DD:  10/29/00 TD:  10/29/00 Job: [id number]

## 2010-09-01 NOTE — Op Note (Signed)
Andrea [name], Andrea [name]           ACCOUNT NO.:  [id number]   MEDICAL RECORD NO.:  [id number]          PATIENT TYPE:  INP   LOCATION:  9114                          [facility]  [facility]   PHYSICIAN:  Charles A. Clearance Coots, M.D.DATE OF BIRTH:  Nov 19, 1980   DATE OF PROCEDURE:  03/07/2005  DATE OF DISCHARGE:                                 OPERATIVE REPORT   PREOPERATIVE DIAGNOSIS:  Desires sterilization.   POSTOPERATIVE DIAGNOSIS:  Desires sterilization.   PROCEDURE:  Bilateral partial salpingectomy.   SURGEON:  Coral Ceo, M.D.   ANESTHESIA:  General.   ESTIMATED BLOOD LOSS:  Negligible.   COMPLICATIONS:  None.   SPECIMEN:  Approximately 2 cm segments of right and left fallopian tube.   OPERATION:  The patient was brought to operating room and after satisfactory  general endotracheal anesthesia, the abdomen was prepped and draped in usual  sterile fashion. A small inferior vertical incision was made below the  umbilicus approximately 1 inch through the skin with a scalpel. The incision  was extended down to the fascia with curved Mayo scissors. Fascia was  grasped in the midline. The fascial incision was extended to left and to  right with curved Mayo scissors. The peritoneum was grasped with forceps and  was incised with Metzenbaum scissors. Right-angle retractors were placed in  the incision. The right fallopian tube was identified and was grasped with a  Babcock clamp. The tube was followed from the cornual end to the fimbrial  end in the grasp of Babcock clamps and was then followed retrograde back to  the isthmic area of the tube and an area in the isthmic portion of the tube  beneath the Babcock clamp was doubly ligated with #1 plain catgut and the  section of tube above the knot was excised with Metzenbaum scissors and  submitted to pathology for evaluation. Same procedure was performed on  opposite side without complications. The abdomen was then closed as follows.  The  fascia and peritoneum was closed as one with continuous suture of 2-0  Vicryl. The skin was closed with a continuous subcuticular suture of 3-0  Monocryl. Sterile bandages applied to the incision closure. Surgical  technician indicated that all needle, sponge and instrument counts were  correct. The patient tolerated procedure well transported to recovery room  in satisfactory condition.      Charles A. Clearance Coots, M.D.  Electronically Signed     CAH/MEDQ  D:  03/07/2005  T:  03/07/2005  Job:  [id number]

## 2010-09-01 NOTE — Op Note (Signed)
[place]. [facility]  Patient:    Andrea [name], Andrea [name]                    MRN: [id number] Proc. Date: 10/29/00 Adm. Date:  [id number] Attending:  Twana First                           Operative Report  PREOPERATIVE DIAGNOSES: 1. 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 2. 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  POSTOPERATIVE DIAGNOSES: 1. 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 2. 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  PROCEDURE: 1. Left knee EUA followed by arthroscopic chondroplasty lateral compartment    and patellofemoral joint. 2. Left knee medial patellofemoral ligament repair/reefing.  SURGEON:  Elana Alm. Thurston Hole, M.D.  ASSISTANT:  Julien Girt, P.A.  ANESTHESIA:  General.  OPERATIVE TIME:  50 minutes.  COMPLICATIONS:  None.  INDICATIONS FOR PROCEDURE:  Andrea [name] is a 30 year old woman who has had painful recurrent patella dislocations and instability with chondromalacia who has failed conservative care and is now to undergo arthroscopy and medial patellofemoral ligament repair and patellofemoral reefing.  DESCRIPTION:  Andrea [name] is brought to the operating room on October 29, 2000, placed on the operative table in supine position.  After an adequate level of general anesthesia was obtained her left knee was examined under anesthesia. She had frank subluxability of the left patella with almost dislocatability compared to none on the right.  After this was done, the knee was tested for other ligamentous instabilities, which she did not have, with normal ligamentous exam.  Full range of motion noted.  Left leg was prepped using sterile Betadine and draped using sterile technique.  She received Ancef 1 g IV preoperatively for prophylaxis.  The leg was exsanguinated and a thigh tourniquet elevated 350 mm.  Initially, through an  inferolateral portal, the arthroscope with a pump attached was placed and through an inferior medial portal an arthroscopic probe was placed.  On initial inspection of the media compartment, articular cartilage, medial femoral condyle, medial tibial plateau showed 25% grade 3 chondromalacia, which was debrided.  Medial meniscus was probed and this was found to be normal.  Intercondylar notch inspected, anterior and posterior cruciate ligaments were normal.  Lateral compartment inspected.  She had severe grade 3 chondromalacia over 75% of her lateral femoral condyle which was thoroughly debrided to a stable but thin base and a stable rim.  She had 25% grade 3 changes on the lateral tibial plateau which was debrided.  The lateral meniscus showed significant superficial fibrillation but no frank tearing.  Patellofemoral joint showed grade 3 chondromalacia over the entire medial patella facet which was debrided.  The lateral patella facet showed only mild grade 1 changes.  The femoral groove showed only mild grade 1 and 2 changes.  There was obvious lateral patella subluxation and the patella did not seat well in the femoral groove at all, which was very shallow through a full range of motion.  The medial and lateral gutters showed mild synovitis but no other pathology. After this was done, the arthroscopic instruments were removed and a 4 cm medial incision was made over the medial femoral condyle.  The underlying subcutaneous tissues were incised in line with the skin incision.  The retinaculum over the medial patellofemoral ligament was incised, revealing an underlying, very attenuated patellofemoral ligament which was then incised longitudinally, and  then a reefing procedure using #1 Panacryl suture, multiple mattress sutures, placed through the bone and reefing the medial patellofemoral ligament back down, tightening this back down to the medial femoral condyle.  Four of these mattress  sutures were placed.  After this was done, then the arthroscope was placed back in the joint and there was found to be significant improvement in patellofemoral tracking and tightness of the lax medial structures.  After this was done, then the retinaculum over the medial patellofemoral ligament repair was closed also with #1 Panacryl suture.  After this was done, it was felt that there was excellent restoration of normal patella tightness, mobility, and normal patella tracking.  The wound was then irrigated and closed using 2-0 Vicryl in the subcutaneous tissues and 3-0 Prolene in the subcuticular layer.  Steri-Strips were applied.  Sterile dressings were applied after the knee was injected with 0.25% Marcaine with epinephrine.  The tourniquet was released.  A long knee immobilizer was placed and then the patient awakened and taken to the recovery room in stable condition.  FOLLOW-UP CARE:  Ms. Gitto will be followed overnight at the recovery care center for IV pain control and neurovascular monitoring.  Discharge tomorrow on Percocet and Naprosyn.  See her back in the office in a week for sutures out and follow-up. DD:  10/29/00 TD:  10/29/00 Job: [id number]

## 2010-09-01 NOTE — Discharge Summary (Signed)
[facility]  Patient:    Andrea [name], Andrea [name]                    MRN: [id number] Adm. Date:  [id number] Disc. Date: [id number] Attending:  Michaelle Copas                           Discharge Summary  HISTORY OF PRESENT ILLNESS:   The patient is an African-American female at 30-3/7 weeks by sure last menstrual period, who has had prior prenatal care in [place], [place].  HOSPITAL COURSE:              The patient was admitted on October 05, 1999, with preterm contractions and increase in discharge.  On admission, the cervix was 130, 40%, and high.  The patient had bacterial vaginosis by wet prep.  The patient was started on Unasyn.  The patient received subcu terbutaline, but became very tachycardic and very agitated, so the terbutaline was discontinued.  The patient began contracting again.  The cervix was felt to have changed to 1-2 and 60%.  The patient as begun on magnesium sulfate.  The patient was felt to continue to change to 60-80% effaced.  Due to NICU shortage, the patient was felt to be needed to be transferred to [facility] for continued care. DD:  01/04/00 TD:  01/06/00 Job: [id number]

## 2010-09-01 NOTE — Discharge Summary (Signed)
NAMEMISCHELLE, Andrea [name]           ACCOUNT NO.:  [id number]   MEDICAL RECORD NO.:  [id number]          PATIENT TYPE:  INP   LOCATION:  9114                          [facility]ACILITY:  [facility]   PHYSICIAN:  Charles A. Clearance Coots, M.D.DATE OF BIRTH:  12/15/1980   DATE OF ADMISSION:  12/02/2004  DATE OF DISCHARGE:  12/06/2004                                 DISCHARGE SUMMARY   ADMISSION DIAGNOSES:  1.  Twenty-two weeks' gestation.  2.  Abdominal pain, severe.  3.  Rule out ovarian torsion.   DISCHARGE DIAGNOSES:  1.  Twenty-two weeks' gestation.  2.  Abdominal pain, severe.  3.  Rule out ovarian torsion.  4.  Status post exploratory laparotomy, right ovarian cystectomy for      ruptured right hemorrhagic ovarian cyst.   Discharged home undelivered at 23 weeks' gestation in good condition.   REASON FOR ADMISSION:  A 30 year old black female, G3, P1-1-0-2, at 42  weeks' gestation, who presented with right-sided abdominal pain, acute  onset.  She has a history of thalassemia.  She also has a history of recent  admission for preterm contractions and she has a history of previous preterm  delivery at 36 weeks.   PAST MEDICAL HISTORY:  Surgery:  Tonsils and knee surgery.   Illnesses:  Mitral valve prolapse, thalassemia.   MEDICATIONS:  Prenatal vitamins.   ALLERGIES:  STADOL and TERBUTALINE both cause hallucinations.   SOCIAL HISTORY:  Single.  Negative for tobacco, alcohol or recreational drug  use.   PHYSICAL EXAMINATION:  GENERAL:  A well-nourished, well-developed black  female in moderate distress.  VITAL SIGNS:  Temperature 97.7, pulse 105, respiratory rate 22, blood  pressure 112/65.  HEENT:  Normal.  LUNGS:  Clear to auscultation bilaterally.  CARDIAC:  Regular rate and rhythm.  ABDOMEN:  Gravid, tender to palpation, right-sided tenderness.  Fetal heart  rate 140-150 beats per minute.   ADMISSION LABORATORY VALUES:  Hemoglobin 9.6, hematocrit 30.6, white blood  cell count 7200,  platelets of 242,000.   HOSPITAL COURSE:  The patient was taken to the operating room for severe  right-sided abdominal pain and exploratory laparotomy with right ovarian  cystectomy was performed for a ruptured right hemorrhagic ovarian cyst.  There were no intraoperative complications.  The postoperative course was  uncomplicated.  She was discharged home on postop day #4 in good condition.   DISCHARGE LABORATORY VALUES:  Hemoglobin 8.6, hematocrit 27.5, white blood  cell count 9200, platelets 182,000.   DISCHARGE DISPOSITION:  Medication:  Continue prenatal vitamins.  Tylox was  prescribed for pain.   The patient is to call the office for a follow-up appointment in two weeks.      Charles A. Clearance Coots, M.D.  Electronically Signed     CAH/MEDQ  D:  03/09/2005  T:  03/09/2005  Job:  [id number]

## 2010-09-01 NOTE — H&P (Signed)
Andrea [name], Andrea [name]           ACCOUNT NO.:  [id number]   MEDICAL RECORD NO.:  [id number]          PATIENT TYPE:  INP   LOCATION:  9166                          [facility]ACILITY:  [facility]   PHYSICIAN:  Roseanna Rainbow, M.D.DATE OF BIRTH:  05-Jan-1981   DATE OF ADMISSION:  02/28/2005  DATE OF DISCHARGE:                                HISTORY & PHYSICAL   CHIEF COMPLAINT:  The patient is a 30 year old, para 2, with estimated date  of confinement of March 29, 2005 with an intrauterine pregnancy at 35+  weeks, complaining of back pain and uterine contractions.   HISTORY OF PRESENT ILLNESS:  Please see the above. The patient had similar  complaints several days ago and was hospitalized for several days and then  was discharged to home. In the office today, she had regular contractions.  The fetal heart tracing was reassuring. Sterile vaginal exam showed the  cervix was at least 2 cm dilated and 80% effaced.   ALLERGIES:  No known drug allergies.   MEDICATIONS:  Prenatal vitamins.   RISK FACTORS:  Beta thalassemia, iron-deficiency anemia. She also had an  exploratory laparotomy during the second trimester of pregnancy secondary to  an adnexal mass. Urinary tract infection. History of a previous preterm  delivery.   PRENATAL SCREENS:  Hemoglobin 8.7, hematocrit 28.3. Chlamydia negative. GC  negative. One-hour GCT 108. Hepatitis B surface antigen negative. HIV  negative. Platelets 284,000. Blood type B positive. Antibody screen  negative. RPR nonreactive. Rubella immuned. Sickle cell negative.   OBSTETRICAL HISTORY:  In 2001, she was delivered at 32 weeks of a female. In  1997, she was delivered of a full-term female.   PAST GYNECOLOGIC HISTORY:  Noncontributory.   PAST MEDICAL HISTORY:  Please see the above.   PAST SURGICAL HISTORY:  Left knee arthroscopy. Tonsils and adenoids.   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 Married, living with her  spouse. Does not give any  significant history of alcohol usage. Has no  significant smoking history.   FAMILY HISTORY:  Breast cancer, diabetes, hypertension,  hypercholesterolemia.   PHYSICAL EXAMINATION:  VITAL SIGNS:  Stable. Afebrile.  GENERAL:  Uncomfortable.  PELVIC:  Sterile vaginal exam, please see the above. Fetal heart tracing  reassuring. Tocodynamometer showing uterine contractions every two to four  minutes.   ASSESSMENT:  Intrauterine pregnancy at 35+ weeks, rule out preterm labor.   PLAN:  Admission, expectant management.      Roseanna Rainbow, M.D.  Electronically Signed     LAJ/MEDQ  D:  02/28/2005  T:  03/01/2005  Job:  [id number]

## 2010-09-01 NOTE — Discharge Summary (Signed)
Andrea [name], Andrea [name]                       ACCOUNT NO.:  [id number]   MEDICAL RECORD NO.:  [id number]                   PATIENT TYPE:  INP   LOCATION:  0440                                 [facility]ACILITY:  [facility]   PHYSICIAN:  Sherin Quarry, MD                   DATE OF BIRTH:  30-Mar-1981   DATE OF ADMISSION:  11/05/2002  DATE OF DISCHARGE:  11/08/2002                                 DISCHARGE SUMMARY   HISTORY OF PRESENT ILLNESS:  Andrea [name] is a 30 year old lady who was  initially to the hospital on November 06, 2002, with complaints of severe  substernal chest discomfort associated with the feeling of pressure in the  chest and shortness of breath.  In the emergency room, the patient underwent  a second CT scan of the chest.  I reviewed this study with radiology doctors  at [facility] and compared it to the previous CT scans.  It was  essentially normal.  There were two extremely tiny, apparently non-calcified  pulmonary nodules seen in the right lung unchanged from the study three  weeks previously.  The radiologist suggested a followup CT scan be done in  six months to be sure that this was nothing significant.  Also, in the  emergency room, the patient underwent a chest x-ray which was within normal  limits.  The patient was admitted for evaluation and treatment, and then  very shortly thereafter left the hospital against medical advice.  She then  returned to the hospital in the evening of November 06, 2002, when she was seen  by Dr. Nehemiah [name] in the emergency room.  She was readmitted at that time.   PHYSICAL EXAMINATION:  HEENT:  Within normal limits.  CHEST:  Clear.  There was no wheezing, rhonchi, or rales.  CARDIOVASCULAR:  Normal S1 and S2 without murmurs, rubs, or gallops.  ABDOMEN:  Benign, normal bowel sounds, without masses, tenderness, or  organomegaly.  EXTREMITIES:  No evidence of cyanosis or edema.   LABORATORY DATA:  Sedimentation rate was 8.  Rheumatoid  factor was less then  1:20.  The result of the patient's ANA is pending.  Serial cardiac enzymes  were negative.  Hemoglobin was 10.6.  Subsequently, the patient underwent a  HIDA scan which was normal.  She had an abdominal ultrasound in the very  recent past which showed only a few areas of possible sludge.   HOSPITAL COURSE:  During the course of the patient's hospitalization, she  seemed rather lethargic and depressed, but I have the impression that her  chest pain gradually resolved.  A 2-D echocardiogram was obtained on November 06, 2002, to follow up on the patient's known mitral valve prolapse with  regurgitant murmur.  By November 08, 2002, the patient appeared to be a  candidate for discharge.   DISCHARGE DIAGNOSES:  1. Atypical chest pain in a young woman, probably secondary to  costochondritis.  2. Possible reflux esophagitis.  3. Anemia attributed to menorrhagia.  4. Thalassemia.   DISCHARGE MEDICATIONS:  1. Protonix 40 mg, the dose of which was increased to b.i.d.  2. Percocet 5/325 mg one or two q.4h. p.r.n. pain.  The patient was     dispensed 20 tablets.  3. Vioxx 25 mg daily.   At the time of discharge, the report on the patient's echocardiogram is  still pending.    FOLLOWUP:  1. Followup will be arranged with Dr. Modesto [name] of the [facility].  2. The patient will also continue to see Dr. Darnelle [name] on a monthly basis for     treatment of her iron-deficiency and thalassemia.   CONDITION ON DISCHARGE:  Good.                                                Sherin Quarry, MD    SY/MEDQ  D:  11/08/2002  T:  11/08/2002  Job:  [id number]   cc:   Andrea [name], M.D.  [address]  [place]  [place] [address]  Fax: [phone]   Andrea [name], M.D.  [address] [facility]  [place]  [place] [address]  Fax: [phone]

## 2010-09-01 NOTE — Discharge Summary (Signed)
NAMEMA, MUNOZ           ACCOUNT NO.:  [id number]   MEDICAL RECORD NO.:  [id number]          PATIENT TYPE:  INP   LOCATION:  9310                          [facility]   PHYSICIAN:  Charles A. Clearance Coots, M.D.DATE OF BIRTH:  [birth year]   DATE OF ADMISSION:  11/14/2004  DATE OF DISCHARGE:  11/18/2004                                 DISCHARGE SUMMARY   ADMISSION DIAGNOSES:  1.  Intrauterine pregnancy at 19-20 weeks' gestation.  2.  Threatened preterm labor.   DISCHARGE DIAGNOSES:  1.  Intrauterine pregnancy at 19-20 weeks' gestation, undelivered at 78      weeks' gestation, in good condition.  2.  Threatened preterm labor, much improved after magnesium sulfate      tocolysis and bed rest.   REASON FOR ADMISSION:  This 30 year old, G3, P1-1-0-2, African-American  female with last menstrual period on July 01, 2004, with estimated date of  confinement of April 02, 2005, by ultrasound who presented with several  hours of cramping.  She denied vaginal discharge or recent colitis.   PAST OBSTETRICAL HISTORY:  Previous preterm labor and preterm delivery at 70  weeks' gestation.   PAST SURGICAL HISTORY:  1.  Tonsillectomy.  2.  Knee surgery.   PAST MEDICAL HISTORY:  1.  Thalassemia.  2.  Mitral valve prolapse.   MEDICATIONS:  Prenatal vitamins.   ALLERGIES:  Questionable allergy to TERBUTALINE.  STADOL causes mental  status changes with hallucinations.   SOCIAL HISTORY:  Negative for tobacco, alcohol or recreational drug use.  She is married.   PHYSICAL EXAMINATION:  GENERAL:  Well-nourished, well-developed female in no  acute distress.  VITAL SIGNS:  Temperature 98.3, pulse 94, respirations 20, blood pressure  99/53.  ABDOMEN:  Soft.  Uterus nontender.  Palpable uterine contractions were felt.  PELVIC:  Speculum exam revealed whitish discharge.  Cervix was long, closed  and posterior.  External fetal monitor revealed uterine contractions every 7  minutes.   Doppler fetal heart rate was 150-160 beats per minute.   LABORATORY DATA AND X-RAY FINDINGS:  Urinalysis on admission was within  normal limits.  Hemoglobin 9.2, hematocrit 29.4, white blood cell count  9600, platelets 284,000.  Wet-prep revealed few wbc's, otherwise negative.  GC and Chlamydia cultures were negative.  Group B Streptococcus negative.   HOSPITAL COURSE:  The patient was admitted and started on IV fluid  hydration, bed rest and magnesium sulfate tocolysis.  She responded well to  therapy and by hospital day #3, was not having any uterine contractions and  magnesium sulfate was therefore discontinued.  She continued to not have  uterine contractions and was discharged home on hospital day #4.   DISCHARGE MEDICATIONS:  Prenatal vitamins.   FOLLOW UP:  The patient will call the office for a follow-up appointment in  48 hours.      Charles A. Clearance Coots, M.D.  Electronically Signed     CAH/MEDQ  D:  11/18/2004  T:  11/18/2004  Job:  [id number]

## 2010-09-01 NOTE — Discharge Summary (Signed)
NAMEELIANNE, Andrea [name]           ACCOUNT NO.:  [id number]   MEDICAL RECORD NO.:  [id number]          PATIENT TYPE:  INP   LOCATION:  9166                          [facility]   PHYSICIAN:  Charles A. Clearance Coots, M.D.DATE OF BIRTH:  [birth year]   DATE OF ADMISSION:  02/28/2005  DATE OF DISCHARGE:  03/01/2005                                 DISCHARGE SUMMARY   ADMITTING DIAGNOSES:  1.  A 35 to [redacted] weeks gestation.  2.  Thalassemia.  3.  Preterm uterine contractions.   DISCHARGE DIAGNOSES:  1.  A 35 to [redacted] weeks gestation.  2.  Thalassemia.  3.  Preterm uterine contractions.  4.  Status post admission and expectant management, not in labor.  Discharge      home undelivered at approximately [redacted] weeks gestation, good condition.   REASON FOR ADMISSION:  A 30 year old black female G 3, P 2, estimated date  of confinement of April 09, 2005, presents with strong uterine  contractions.  The patient has a history of ovarian cystectomy earlier  during this pregnancy at approximately late second trimester.  She did well  after surgery.  She has had uterine contractions for the past several weeks.   PAST MEDICAL HISTORY:  Surgery:  Ovarian cystectomy, knee surgery, tonsil  surgery.  Illnesses:  Mitral valve prolapse, thalassemia.   MEDICATIONS:  Prenatal vitamins.   ALLERGIES:  TERBUTALINE, STADOL.   SOCIAL HISTORY:  Single, negative tobacco or alcohol or recreational drug  use.   PHYSICAL EXAMINATION:  VITAL SIGNS:  Afebrile, vital signs are stable.  ABDOMEN:  Gravid, non-tender.  CERVIX:  Closed, 50% effaced and the vertex at -3 station.   IMPRESSION:  1.  A 34 to [redacted] weeks gestation.  2.  Preterm uterine contractions.   PLAN:  Admit, therapeutic rest and observation.   ADMITTING LABORATORY VALUES:  Hemoglobin 8.3, hematocrit 26.3, white blood  cell count 12,400, platelets 175,000.  Urinalysis was within normal limits.  Urine culture was sent.  RPR was nonreactive.   HOSPITAL  COURSE:  The patient was admitted and continued to have uterine  contractions, but had no significant cervical change.  The patient was very  obsessed with being delivered and perinatal consultation was obtained, Dr.  Corky Sox, and the patient was counseled and recommendations were made.  She had no cervical change during the course of observation and was  therefore discharged home undelivered at [redacted] weeks gestation in good  condition.   DISCHARGE DISPOSITION AND MEDICATION:  Continue medications that were taken  prior to coming to the hospital.  The patient to call the office for a  follow-up appointment in one week.      Charles A. Clearance Coots, M.D.  Electronically Signed     CAH/MEDQ  D:  03/28/2005  T:  03/28/2005  Job:  [id number]

## 2010-09-01 NOTE — Discharge Summary (Signed)
NAMEMARJEAN, Andrea [name]                       ACCOUNT NO.:  [id number]   MEDICAL RECORD NO.:  [id number]                   PATIENT TYPE:  OUT   LOCATION:  DAY                                  FACILITY:  [facility]   PHYSICIAN:  Alfonse Spruce, M.D.               DATE OF BIRTH:  1980-10-25   DATE OF ADMISSION:  09/23/2002  DATE OF DISCHARGE:  09/24/2002                                 DISCHARGE SUMMARY   FINAL DIAGNOSES:  1. Acute nausea and vomiting, persistent, with underlying gastritis.     Abdominal pain resolved on discharge.  Possible reaction to IV dextran.  2. Thalassemia.  3. Microcytic hypochromic anemia also with mild iron deficiency.   HOSPITAL COURSE:  The patient initially admitted overnight by Dr. Darrold Span in  the temporary absence of Dr. Darnelle Catalan (who is on vacation at this time)  because of her nausea and vomiting.  Subsequently, the patient was  transferred to my care in the morning of 09/24/02, and the patient underwent  laboratory evaluation, as well as examination, and ultrasound of the  abdomen, and the ultrasound did not show stones to predisposed to the nausea  and vomiting.  Had a normal white count of 4.7, and hemoglobin 8.4, with  hematocrit 26.8, and MCV 8.1, with platelets of 230, which has been in that  level, and recently the patient received IV iron dextran on Monday, and  subsequently she started to have the nausea and vomiting.  The underlying  possibility of reaction to IV dextran is a possibility.  The patient was in  24-hour hold, and her nausea and vomiting were treated.  Subsequently IV  fluids increase to 150 cc per hour, and currently she is comfortable.  Discussed with the patient in detail, and she will be going home in stable  condition.  Advised to continue the Protonix which has been given in the  hospital IV piggyback 40 mg daily for 10 days, as well as Compazine 10 mg  p.o. q.8h. p.r.n. for nausea and vomiting.  The patient refused rectal  suppository.   DIET:  Her diet will be increased from a full liquid diet to increased oral  intake, and subsequently once the feeling improves in her stomach, she will  proceed from full liquid to a regular diet.   FOLLOW UP:  The patient will also be followed by Dr. Darnelle Catalan next week, as  well as her own physician, Dr. Modesto Charon, of family practice at [facility].                                               Alfonse Spruce, M.D.    Wynn Maudlin  D:  09/24/2002  T:  09/24/2002  Job:  [id number]   cc:   Lennis P. Darrold Span, M.D.  [phone]  [facility]  [place]  [place] [address]  Fax: [phone]   Valentino Hue. Magrinat, M.D.  [address] [facility]  [place]  [place] [address]  Fax: [phone]   Maryla Morrow. Modesto Charon, M.D.  [address]  [place]  [place] [address]  Fax: [phone]

## 2010-09-01 NOTE — Op Note (Signed)
NAMELYNNSEY, BARBARA           ACCOUNT NO.:  [id number]   MEDICAL RECORD NO.:  [id number]          PATIENT TYPE:  INP   LOCATION:  9149                          FACILITY:  [facility]   PHYSICIAN:  Roseanna Rainbow, M.D.DATE OF BIRTH:  [birth year]   DATE OF PROCEDURE:  12/02/2004  DATE OF DISCHARGE:                                 OPERATIVE REPORT   PREOPERATIVE DIAGNOSIS:  Intrauterine pregnancy at 22 plus weeks with rule  out right-sided adnexal torsion.   POSTOPERATIVE DIAGNOSIS:  Ruptured hemorrhagic right ovarian cyst.   PROCEDURE:  Exploratory laparotomy, right ovarian cystectomy.   SURGEON:  Roseanna Rainbow, M.D. and Bing Neighbors. Clearance Coots, M.D.   ANESTHESIA:  General endotracheal.   COMPLICATIONS:  None.   ESTIMATED BLOOD LOSS:  Minimal.   FLUIDS AND URINE OUTPUT:  As per anesthesiology.   FINDINGS:  Upon entering the peritoneal cavity there was noted to be blood  clots in the right upper quadrant on the right. As the right adnexa was  exteriorized, the right ovarian cyst was necrotic and was fractured away  bluntly from the right ovary. The remaining right ovarian cortex appeared  somewhat edematous. There was no evidence of compromise blood flow ie  duskiness.   PROCEDURE:  The risks, benefits, indications and alternatives of the  procedure were reviewed with the patient and informed consent was obtained.  She was taken to the operating room with an IV running and a Foley catheter  in place. The patient was placed in the dorsal supine position with a  leftward tilt, given a general anesthesia and prepped and draped in the  usual sterile fashion. A right paramedian vertical skin incision was made  with a scalpel and carried down to the underlying fascia. The fascia was  incised along the length of the incision. The parietal peritoneum was tented  up and entered sharply. This incision was then extended superiorly and  inferiorly. The above findings were noted. The  ovarian cyst was removed  bluntly. The remaining ovarian cortex defect was oversewn with 4-0 Monocryl.  Adequate hemostasis was noted. The abdomen was gently irrigated and the  hemoperitoneum was suctioned with a pool suction. The fascia and parietal  peritoneum were closed in a single layer using 0 Vicryl in a running  fashion. The skin was reapproximated with staples. At the close of the  procedure the instrument and pack counts were said to be correct x2. The  patient was taken to the PACU awake and in stable condition.     Roseanna Rainbow, M.D.  Electronically Signed    LAJ/MEDQ  D:  12/03/2004  T:  12/04/2004  Job:  [id number]

## 2010-09-01 NOTE — Consult Note (Signed)
[facility]  Patient:    Andrea [name], Andrea [name]                    MRN: [id number] Proc. Date: 11/02/99 Adm. Date:  [id number] Attending:  Michaelle Copas                          Consultation Report  REASON FOR CONSULTATION:  Anemia and thrombocytopenia in a 30 year old who is [redacted] weeks gestation, now with preterm labor which is stabilized. The patient is know to Dr. Marikay Alar Magrinat July 10 on referral for the same, anemia and thrombocytopenia.  His work-up states that she most likely has a thalassemia with an element of iron deficiency as well as thrombocytopenia with increased antiplatelet antibodies but a negative ELISA.  On the last visit, Dr. Darnelle Catalan started her on iron therapy and did not advise placing her on steroids.  PAST MEDICAL HISTORY:  Significant for mitral valve prolapse.  FAMILY HISTORY:  Family history of breast cancer in her mother.  GYNECOLOGIC HISTORY:  She is G2, F1, PO, AO, L1, FA.  SOCIAL HISTORY:  No tobacco.  ALLERGIES:  Allergies to TAPE.  REVIEW OF SYSTEMS:  She has some nausea and vomiting and some abdominal pain, otherwise there is no headache, no shortness of breath, no bowel or bladder changes.  PHYSICAL EXAMINATION:  VITAL SIGNS:  On examination she is afebrile.  Her systolic blood pressure is 80-90, heart rate 80, respirations 18.  HEENT:  There is no icterus.  Pupils, equal, round, reactive to light and accommodation.  Extraocular muscles intact.  Oropharynx is negative.  CHEST:  Clear.  HEART:  Regular rate and rhythm.  She has a 2/6 systolic ejection murmur.  ABDOMEN:  Positive bowel sounds, soft, hepatosplenomegaly.  Unable to assess with a gravid state.  EXTREMITIES:  No edema.  No petechiae.  No purpura.  LABORATORY DATA:  Panel of labs include a white count of 8.1, hemoglobin 7.5, hematocrit 24.7, platelet count 77, reticulocyte count 1.26.  IMPRESSION:  This is a 30 year old, now 34 weeks  preterm labor now controlled with probable thalassemia and thrombocytopenia of uncertain etiology, possibly autoimmune related, although certainly less likely with a negative enzyme-linked immunoabsorbent assay for specific antibodies.  RECOMMENDATIONS:  At this point recommend transfusing packed cells to keep a hemoglobin greater than 9 for the benefit of the baby.  Would continue iron and at this point would not recommend platelet transfusions.  At this point, her platelet count being the 70 and 80,000 is adequate to go through labor.  I also would not, at this point, give her steroids given that we have a very low suspicion that this is autoimmune in nature.  However, if she does begin to trend down with the platelet count, prednisone 80 mg a day could be started in the rare situation that she indeed does have an autoimmune platelet dysfunction even though she has a negative ELISA.  I would also recommend checking coagulation studies and a DIC panel. DD:  11/02/99 TD:  11/04/99 Job: [id number]

## 2010-09-05 ENCOUNTER — Encounter: Payer: Self-pay | Admitting: Cardiovascular Disease

## 2010-09-05 DIAGNOSIS — I10 Essential (primary) hypertension: Secondary | ICD-10-CM | POA: Insufficient documentation

## 2010-09-05 DIAGNOSIS — I341 Nonrheumatic mitral (valve) prolapse: Secondary | ICD-10-CM | POA: Insufficient documentation

## 2010-09-05 DIAGNOSIS — R011 Cardiac murmur, unspecified: Secondary | ICD-10-CM | POA: Insufficient documentation

## 2010-09-07 ENCOUNTER — Ambulatory Visit: Payer: Medicaid Other | Admitting: Cardiovascular Disease

## 2011-01-22 LAB — CBC
HCT: 38.1
Hemoglobin: 12.2
WBC: 6.3

## 2011-01-22 LAB — TYPE AND SCREEN
ABO/RH(D): B POS
Antibody Screen: NEGATIVE

## 2011-01-22 LAB — PREGNANCY, URINE: Preg Test, Ur: NEGATIVE

## 2011-03-30 ENCOUNTER — Telehealth: Payer: Self-pay | Admitting: Oncology

## 2011-03-30 NOTE — Telephone Encounter (Signed)
pt called and stated that she needed appt for next week because she was not feeling well.  scheduled appt for 04/05/2011

## 2011-04-05 ENCOUNTER — Encounter: Payer: Self-pay | Admitting: Physician Assistant

## 2011-04-05 ENCOUNTER — Ambulatory Visit (HOSPITAL_BASED_OUTPATIENT_CLINIC_OR_DEPARTMENT_OTHER): Payer: Medicaid Other

## 2011-04-05 ENCOUNTER — Ambulatory Visit (HOSPITAL_BASED_OUTPATIENT_CLINIC_OR_DEPARTMENT_OTHER): Payer: Medicaid Other | Admitting: Physician Assistant

## 2011-04-05 ENCOUNTER — Ambulatory Visit: Payer: Medicaid Other | Admitting: Physician Assistant

## 2011-04-05 VITALS — BP 108/72 | HR 90 | Temp 98.5°F | Ht 65.0 in | Wt 150.3 lb

## 2011-04-05 DIAGNOSIS — D569 Thalassemia, unspecified: Secondary | ICD-10-CM

## 2011-04-05 DIAGNOSIS — M2550 Pain in unspecified joint: Secondary | ICD-10-CM

## 2011-04-05 DIAGNOSIS — D509 Iron deficiency anemia, unspecified: Secondary | ICD-10-CM | POA: Insufficient documentation

## 2011-04-05 HISTORY — DX: Thalassemia, unspecified: D56.9

## 2011-04-05 HISTORY — DX: Iron deficiency anemia, unspecified: D50.9

## 2011-04-05 LAB — COMPREHENSIVE METABOLIC PANEL
ALT: 13 U/L (ref 0–35)
AST: 19 U/L (ref 0–37)
CO2: 26 mEq/L (ref 19–32)
Calcium: 9.6 mg/dL (ref 8.4–10.5)
Chloride: 105 mEq/L (ref 96–112)
Sodium: 138 mEq/L (ref 135–145)
Total Protein: 7.9 g/dL (ref 6.0–8.3)

## 2011-04-05 LAB — CBC WITH DIFFERENTIAL/PLATELET
BASO%: 1 % (ref 0.0–2.0)
Eosinophils Absolute: 0.1 10*3/uL (ref 0.0–0.5)
MCHC: 32.2 g/dL (ref 31.5–36.0)
MONO#: 0.6 10*3/uL (ref 0.1–0.9)
NEUT#: 3.1 10*3/uL (ref 1.5–6.5)
RBC: 5.03 10*6/uL (ref 3.70–5.45)
RDW: 15.5 % — ABNORMAL HIGH (ref 11.2–14.5)
WBC: 6.2 10*3/uL (ref 3.9–10.3)
lymph#: 2.4 10*3/uL (ref 0.9–3.3)
nRBC: 0 % (ref 0–0)

## 2011-04-05 LAB — FERRITIN: Ferritin: 8 ng/mL — ABNORMAL LOW (ref 10–291)

## 2011-04-05 NOTE — Progress Notes (Signed)
Hematology and Oncology Follow Up Visit  Andrea [name] [id number] [birth year] 30 y.o. 04/05/2011    HPI: 30 year old [place] woman, followed for a history of iron deficiency anemia and thalassemia. Patient is intolerant of oral iron operations. Has received Venofer through our office in the past, most recently in October 2011.  Interim History:   Patient seen today for routine followup. She contacted our office earlier this week with complaints of weakness, fatigue, and joint pain. She has missed several of her last appointments, and in fact has not been seen here in a little over one year.  The patient tells me that joint pain has been a chronic issue for her for some time. It seems to be worsening however. She has pain in her neck shoulders and knees, in addition to chronic fatigue. (I do not have lab results today, so do not know the status of her anemia her ferritin level.) She also feels a little lightheaded at times, a little dizzy, but denies any abnormal headaches or loss of consciousness. No change in vision.   Otherwise interval history is remarkable for the patient being diagnosed with IBS.  A detailed review of systems is otherwise noncontributory as noted below.  Review of Systems: Constitutional:  fatigued and generally weak Eyes: negative WUJ:[id number] Cardiovascular: no chest pain or dyspnea on exertion Respiratory: no cough, shortness of breath, or wheezing Neurological: negative Dermatological: negative Gastrointestinal: no abdominal pain, change in bowel habits, or black or bloody stools Genito-Urinary: no dysuria, trouble voiding, or hematuria Hematological and Lymphatic: negative Breast: negative Musculoskeletal: positive for - joint pain and joint stiffness Remaining ROS negative.   Medications:   I have reviewed the patient's current medications.  Current Outpatient Prescriptions  Medication Sig Dispense Refill  . etonogestrel-ethinyl estradiol  (NUVARING) 0.12-0.015 MG/24HR vaginal ring Place 1 each vaginally every 28 (twenty-eight) days. Insert vaginally and leave in place for 3 consecutive weeks, then remove for 1 week.        Current Facility-Administered Medications  Medication Dose Route Frequency Provider Last Rate Last Dose  . DISCONTD: 0.9 %  sodium chloride infusion  500 mL Intravenous Continuous Iva Boop, MD        Allergies:  Allergies  Allergen Reactions  . Stadol (Butorphanol Tartrate)     hallucinations  . Tributyl Phosphate     tributaline- hallucinations    Physical Exam: Filed Vitals:   04/05/11 1440  BP: 108/72  Pulse: 90  Temp: 98.5 F (36.9 C)   HEENT:  Sclerae anicteric, conjunctivae pink.  Oropharynx clear.  No mucositis or candidiasis.   Nodes:  No cervical, supraclavicular, or axillary lymphadenopathy palpated.  Breast Exam:  Deferred  Lungs:  Clear to auscultation bilaterally.  No crackles, rhonchi, or wheezes.   Heart:  Regular rate and rhythm. Mid systolic murmur.  Abdomen:  Soft, nontender.  Positive bowel sounds.  No organomegaly or masses palpated.   Musculoskeletal:  No focal spinal tenderness to palpation.  Extremities:  Benign.  No peripheral edema or cyanosis.   Skin:  Benign.   Neuro:  Nonfocal.   Lab Results: Lab Results  Component Value Date   WBC 8.3 08/07/2010   HGB 11.0* 08/07/2010   HCT 34.2* 08/07/2010   MCV 70.8* 08/07/2010   PLT 255.0 08/07/2010   NEUTROABS 4.4 08/07/2010     Chemistry      Component Value Date/Time   NA 139 08/07/2010 1606   K 3.5 08/07/2010 1606   CL 102 08/07/2010  1606   CO2 24 08/07/2010 1606   BUN 9 08/07/2010 1606   CREATININE 0.9 08/07/2010 1606      Component Value Date/Time   CALCIUM 8.7 08/07/2010 1606   ALKPHOS 58 08/07/2010 1606   AST 23 08/07/2010 1606   ALT 21 08/07/2010 1606   BILITOT 0.9 08/07/2010 1606        Radiological Studies:  No results found.   Assessment:  A 30 year old [place] woman with history of iron  deficiency anemia as well as thalassemia, intolerant of oral iron preparations.  History of Venofer received through our office, most recently status post 2 doses of Venofer given weekly, 10/20 and 02/09/2010.  Plan:  We are obtaining labs on the patient today, including a CBC, ferritin level, and metabolic panel. We'll plan on following up with her approximately 6 weeks, but we will call and schedule her for IV iron is necessary prior to that appointment. (Of course this depends on lab results.)  Also referring the patient for a rheumatology consult. The joint pain does seem to be a chronic issue that has become more and more problematic for the patient.   This plan was reviewed with the patient, who voices understanding and agreement.  She knows to call with any changes or problems.    Marshal Schrecengost, PA-C 04/05/2011

## 2011-04-06 ENCOUNTER — Telehealth: Payer: Self-pay | Admitting: Oncology

## 2011-04-06 NOTE — Telephone Encounter (Signed)
Gv pt appt for appt for jan-feb2013, called [facility], lmovm for new pt with info to schedule appt with Dr. Alben Deeds.  gv copy of orders to medical recs to fax over info for appt

## 2011-04-09 ENCOUNTER — Other Ambulatory Visit: Payer: Self-pay | Admitting: Physician Assistant

## 2011-04-09 DIAGNOSIS — D509 Iron deficiency anemia, unspecified: Secondary | ICD-10-CM

## 2011-04-09 DIAGNOSIS — D569 Thalassemia, unspecified: Secondary | ICD-10-CM

## 2011-04-12 ENCOUNTER — Other Ambulatory Visit: Payer: Self-pay | Admitting: Physician Assistant

## 2011-04-18 ENCOUNTER — Other Ambulatory Visit: Payer: Self-pay | Admitting: Oncology

## 2011-04-18 DIAGNOSIS — D509 Iron deficiency anemia, unspecified: Secondary | ICD-10-CM

## 2011-04-19 ENCOUNTER — Ambulatory Visit (HOSPITAL_BASED_OUTPATIENT_CLINIC_OR_DEPARTMENT_OTHER): Payer: Medicaid Other

## 2011-04-19 ENCOUNTER — Other Ambulatory Visit (HOSPITAL_BASED_OUTPATIENT_CLINIC_OR_DEPARTMENT_OTHER): Payer: Medicaid Other | Admitting: Lab

## 2011-04-19 DIAGNOSIS — D509 Iron deficiency anemia, unspecified: Secondary | ICD-10-CM

## 2011-04-19 DIAGNOSIS — D569 Thalassemia, unspecified: Secondary | ICD-10-CM

## 2011-04-19 LAB — CBC WITH DIFFERENTIAL/PLATELET
Eosinophils Absolute: 0.2 10*3/uL (ref 0.0–0.5)
HGB: 11 g/dL — ABNORMAL LOW (ref 11.6–15.9)
LYMPH%: 36.9 % (ref 14.0–49.7)
MONO#: 0.6 10*3/uL (ref 0.1–0.9)
Platelets: 278 10*3/uL (ref 145–400)
RBC: 5.26 10*6/uL (ref 3.70–5.45)
RDW: 15.6 % — ABNORMAL HIGH (ref 11.2–14.5)
nRBC: 0 % (ref 0–0)

## 2011-04-19 LAB — FERRITIN: Ferritin: 5 ng/mL — ABNORMAL LOW (ref 10–291)

## 2011-04-19 MED ORDER — IRON SUCROSE 20 MG/ML IV SOLN
200.0000 mg | Freq: Once | INTRAVENOUS | Status: AC
  Administered 2011-04-19: 200 mg via INTRAVENOUS
  Filled 2011-04-19: qty 10

## 2011-04-19 MED ORDER — SODIUM CHLORIDE 0.9 % IV SOLN
Freq: Once | INTRAVENOUS | Status: AC
  Administered 2011-04-19: 12:00:00 via INTRAVENOUS

## 2011-04-19 NOTE — Patient Instructions (Signed)
Pt aware of next appt, knows to call if any problems.  AVS printed for pt.

## 2011-04-25 ENCOUNTER — Telehealth: Payer: Self-pay | Admitting: Oncology

## 2011-04-25 NOTE — Telephone Encounter (Signed)
pt called to check on appt with Dr. Dierdre Forth, called there office lmovm to rtn call with appt for pt

## 2011-04-26 ENCOUNTER — Ambulatory Visit: Payer: Medicaid Other

## 2011-04-30 ENCOUNTER — Telehealth: Payer: Self-pay | Admitting: Oncology

## 2011-04-30 ENCOUNTER — Ambulatory Visit: Payer: Medicaid Other

## 2011-04-30 NOTE — Telephone Encounter (Signed)
called pts and the number is not in service called work and was informed that no one worked there.  Informed Andrea [name] tht Dr. Dierdre Forth does not accept pts insurance. she stated that she would look for one and call me back

## 2011-05-02 ENCOUNTER — Telehealth: Payer: Self-pay | Admitting: Oncology

## 2011-05-02 NOTE — Telephone Encounter (Signed)
called pt and lmovm that I called Dr. Epifania Gore @ [facility] for appt and I have to fax over all required info to there office for the MD to review to see if she can be seen by her.  asked pt to rtn call to confirm call

## 2011-05-07 ENCOUNTER — Telehealth: Payer: Self-pay | Admitting: Oncology

## 2011-05-07 ENCOUNTER — Ambulatory Visit (HOSPITAL_BASED_OUTPATIENT_CLINIC_OR_DEPARTMENT_OTHER): Payer: Medicaid Other

## 2011-05-07 DIAGNOSIS — D509 Iron deficiency anemia, unspecified: Secondary | ICD-10-CM

## 2011-05-07 DIAGNOSIS — D569 Thalassemia, unspecified: Secondary | ICD-10-CM

## 2011-05-07 MED ORDER — SODIUM CHLORIDE 0.9 % IV SOLN: Freq: Once | INTRAVENOUS | Status: DC

## 2011-05-07 MED ORDER — SODIUM CHLORIDE 0.9 % IV SOLN
200.0000 mg | Freq: Once | INTRAVENOUS | Status: AC
  Administered 2011-05-07: 200 mg via INTRAVENOUS
  Filled 2011-05-07: qty 10

## 2011-05-07 MED ORDER — SODIUM CHLORIDE 0.9 % IJ SOLN
10.0000 mL | INTRAMUSCULAR | Status: DC | PRN
  Filled 2011-05-07: qty 10

## 2011-05-07 NOTE — Telephone Encounter (Signed)
called Dr. Nickola Major office and was told that pt has to have primary office call to schedule referral .  called pts lmovm of information and to call our office if they need any information to send to there office for appt

## 2011-05-07 NOTE — Telephone Encounter (Signed)
pt came by today about appt with Dr. Nickola Major.  Informed the pt I had not heard anything from there office but will call them today for f/u.  called there office lmovm to rtn my call to schedule appt for new pt consult with Dr. Nickola Major

## 2011-05-17 ENCOUNTER — Other Ambulatory Visit: Payer: Medicaid Other

## 2011-05-23 ENCOUNTER — Telehealth: Payer: Self-pay | Admitting: *Deleted

## 2011-05-23 NOTE — Telephone Encounter (Signed)
patient confirmed over the phone the new date and time in 05-2011

## 2011-05-24 ENCOUNTER — Ambulatory Visit: Payer: Medicaid Other | Admitting: Physician Assistant

## 2011-06-04 ENCOUNTER — Ambulatory Visit: Payer: Medicaid Other | Admitting: Physician Assistant

## 2011-06-04 NOTE — Progress Notes (Signed)
[facility] today.  Orders placed to reschedule lab and visit.  

## 2011-06-20 ENCOUNTER — Telehealth: Payer: Self-pay | Admitting: Oncology

## 2011-06-20 NOTE — Telephone Encounter (Signed)
pt called and stated that she found a rheumatologist @ [facility] and she need [place] to fax over notes to [phone].  Gv information to medical recs to fax over info

## 2011-06-26 ENCOUNTER — Other Ambulatory Visit: Payer: Self-pay | Admitting: Physician Assistant

## 2011-06-27 ENCOUNTER — Telehealth: Payer: Self-pay | Admitting: Oncology

## 2011-06-27 ENCOUNTER — Other Ambulatory Visit: Payer: Medicaid Other | Admitting: Lab

## 2011-06-27 NOTE — Telephone Encounter (Signed)
received a vm from [employer] and they informed me that they are not accepting new medicaid pts at this time.  called pt lmovm of info and that I have informed the scheduler to let amy berry know

## 2011-06-28 ENCOUNTER — Ambulatory Visit: Payer: Medicaid Other

## 2011-06-28 ENCOUNTER — Ambulatory Visit (HOSPITAL_BASED_OUTPATIENT_CLINIC_OR_DEPARTMENT_OTHER): Payer: Medicaid Other | Admitting: Lab

## 2011-06-28 DIAGNOSIS — D509 Iron deficiency anemia, unspecified: Secondary | ICD-10-CM

## 2011-06-28 LAB — COMPREHENSIVE METABOLIC PANEL
Albumin: 3.6 g/dL (ref 3.5–5.2)
CO2: 26 mEq/L (ref 19–32)
Glucose, Bld: 89 mg/dL (ref 70–99)
Potassium: 3.3 mEq/L — ABNORMAL LOW (ref 3.5–5.3)
Sodium: 138 mEq/L (ref 135–145)
Total Protein: 7.5 g/dL (ref 6.0–8.3)

## 2011-06-28 LAB — CBC WITH DIFFERENTIAL/PLATELET
Basophils Absolute: 0 10*3/uL (ref 0.0–0.1)
Eosinophils Absolute: 0.1 10*3/uL (ref 0.0–0.5)
HGB: 11.4 g/dL — ABNORMAL LOW (ref 11.6–15.9)
NEUT#: 4.5 10*3/uL (ref 1.5–6.5)
RDW: 15.8 % — ABNORMAL HIGH (ref 11.2–14.5)
lymph#: 2.6 10*3/uL (ref 0.9–3.3)

## 2011-06-28 LAB — FERRITIN: Ferritin: 32 ng/mL (ref 10–291)

## 2011-07-02 ENCOUNTER — Telehealth: Payer: Self-pay | Admitting: Oncology

## 2011-07-02 NOTE — Telephone Encounter (Signed)
Called pt re rescheduling 3/19 appt due to AB out sick. Per pt she will call back to r/s because she does not have her date book w/her. Also per pt she would like someone to call to discuss her lbs w/her because she is going to have to reschedule for about 3wks out. lmonvm for desk nurse and tami re pt's wish to have someone call her w/lbs results.

## 2011-07-03 ENCOUNTER — Ambulatory Visit: Payer: Medicaid Other | Admitting: Physician Assistant

## 2011-07-03 ENCOUNTER — Encounter: Payer: Medicaid Other | Admitting: Nutrition

## 2011-07-17 ENCOUNTER — Encounter: Payer: Self-pay | Admitting: Cardiovascular Disease

## 2011-07-17 ENCOUNTER — Ambulatory Visit (INDEPENDENT_AMBULATORY_CARE_PROVIDER_SITE_OTHER): Payer: Medicaid Other | Admitting: Cardiovascular Disease

## 2011-07-17 VITALS — BP 120/76 | HR 88 | Ht 65.0 in | Wt 161.4 lb

## 2011-07-17 DIAGNOSIS — I059 Rheumatic mitral valve disease, unspecified: Secondary | ICD-10-CM

## 2011-07-17 DIAGNOSIS — R42 Dizziness and giddiness: Secondary | ICD-10-CM | POA: Insufficient documentation

## 2011-07-17 DIAGNOSIS — I10 Essential (primary) hypertension: Secondary | ICD-10-CM

## 2011-07-17 DIAGNOSIS — I341 Nonrheumatic mitral (valve) prolapse: Secondary | ICD-10-CM

## 2011-07-17 DIAGNOSIS — R011 Cardiac murmur, unspecified: Secondary | ICD-10-CM

## 2011-07-17 MED ORDER — MECLIZINE HCL 25 MG PO TABS: 25.0000 mg | ORAL_TABLET | Freq: Three times a day (TID) | ORAL | Status: AC | PRN

## 2011-07-17 NOTE — Assessment & Plan Note (Signed)
Andrea [name] presents with symptoms of dizziness. It is difficult to discern whether these are due to vertigo or not. They do not sound orthostatic. She has had these episodes while sitting and while standing. They do not seem to occur after twisting or turning her head.  Her blood pressure heart rate are normal. I would like to place an event monitor on her for further evaluation of these episodes of lightheadedness. She has significant mitral valve prolapse we will also get an echocardiogram. I've recommended that she take meclizine 25 mg a day on an as-needed basis. I'll see her back in the office in several months for followup office visit. We'll see her sooner if her symptoms worsen.

## 2011-07-17 NOTE — Assessment & Plan Note (Addendum)
Andrea [name] has mitral valve prolapse. Her murmur sounds the same. Her last echocardiogram was 2 years ago.  We talked about the possibility of her needing a mitral valve repair sometime in the future. We'll continue to keep an eye on her valve and will get echoes every year or so.

## 2011-07-17 NOTE — Patient Instructions (Addendum)
Your physician recommends that you schedule a follow-up appointment in: 3 MONTHS   Your physician has recommended that you wear an event monitor. Event monitors are medical devices that record the heart's electrical activity. Doctors most often [place] these monitors to diagnose arrhythmias. Arrhythmias are problems with the speed or rhythm of the heartbeat. The monitor is a small, portable device. You can wear one while you do your normal daily activities. This is usually used to diagnose what is causing palpitations/syncope (passing out).   Your physician has requested that you have an echocardiogram. Echocardiography is a painless test that uses sound waves to create images of your heart. It provides your doctor with information about the size and shape of your heart and how well your heart's chambers and valves are working. This procedure takes approximately one hour. There are no restrictions for this procedure.  Your physician has recommended you make the following change in your medication:   TAKE OVER THE COUNTER MECLIZINE 25 MG ONE TABLET 3 TIMES A DAY AS NEEDED

## 2011-07-17 NOTE — Progress Notes (Signed)
Andrea [name] Date of Birth  02-12-1981 [facility]     [place] Office  [address]    [address]   [address] [place], [place]  [address]    [place], [place]  [address] [phone]  Fax  [phone]  [phone]  Fax [phone]  Problems: 1. Mitral Valve Prolapse  History of Present Illness:  Andrea [name] is a 31 y.o. with MVP.  She presents today with symptoms of dizziness.  She has these episodes at various times. She had one severe episode while driving. It was described as a very slow motion sensation that the room was spinning. She also these episodes while standing and sitting.   She has the sensation that she spinning.  These episodes started in November or December but not particularly worsened but because of their persistence she presents today for further evaluation. They're not necessarily related to turning of her head. They're not orthostatic in nature   Current Outpatient Prescriptions on File Prior to Visit  Medication Sig Dispense Refill  . etonogestrel-ethinyl estradiol (NUVARING) 0.12-0.015 MG/24HR vaginal ring Place 1 each vaginally every 28 (twenty-eight) days. Insert vaginally and leave in place for 3 consecutive weeks, then remove for 1 week.         Allergies  Allergen Reactions  . Stadol (Butorphanol Tartrate)     hallucinations  . Tributyl Phosphate     tributaline- hallucinations    Past Medical History  Diagnosis Date  . Anemia   . Anxiety   . Arthritis   . Kidney stones   . Beta thalassemia major     dx 10 years ago, Dr. Darnelle Catalan  . Mitral regurgitation due to cusp prolapse   . Hypertension   . MVP (mitral valve prolapse)   . Heart murmur   . Anemia, iron deficiency 04/05/2011  . Thalassemia 04/05/2011    Past Surgical History  Procedure Date  . Ovarian cyst removal     rupture  . Knee surgery     left x 2  . Tonsillectomy   . Tubal ligation     History  Smoking status  . Former Smoker  . Types:  Cigarettes  . Quit date: 04/16/2000  Smokeless tobacco  . Never Used    History  Alcohol Use  . 2.4 oz/week  . 4 Glasses of wine per week    1-2 per week    Family History  Problem Relation Age of Onset  . Diabetes Father   . Hypertension Father   . Breast cancer Mother     Reviw of Systems:  Reviewed in the HPI.  All other systems are negative.  Physical Exam: Blood pressure 120/76, pulse 88, height 5\' 5"  (1.651 m), weight 161 lb 6.4 oz (73.211 kg). General: Well developed, well nourished, in no acute distress.  Head: Normocephalic, atraumatic, sclera non-icteric, mucus membranes are moist,   Neck: Supple. Carotids are 2 + without bruits. No JVD  Lungs: Clear bilaterally to auscultation.  Heart: regular rate.  normal  S1 S2. She has a 3/6 late peaking systolic murmur at the left sternal border radiating to the axilla. Denies nondisplaced.  Abdomen: Soft, non-tender, non-distended with normal bowel sounds. No hepatomegaly. No rebound/guarding. No masses.  Msk:  Strength and tone are normal  Extremities: No clubbing or cyanosis. No edema.  Distal pedal pulses are 2+ and equal bilaterally.  Neuro: Alert and oriented X 3. Moves all extremities spontaneously.  Psych:  Responds to questions appropriately with  a normal affect.  ECG: 07/17/2011-normal sinus rhythm at 80 beats a minute. She has no ST or T wave changes.  Assessment / Plan:

## 2011-07-27 ENCOUNTER — Encounter (INDEPENDENT_AMBULATORY_CARE_PROVIDER_SITE_OTHER): Payer: Medicaid Other

## 2011-07-27 ENCOUNTER — Ambulatory Visit (HOSPITAL_COMMUNITY): Payer: Medicaid Other | Attending: Internal Medicine

## 2011-07-27 ENCOUNTER — Other Ambulatory Visit: Payer: Self-pay

## 2011-07-27 DIAGNOSIS — I341 Nonrheumatic mitral (valve) prolapse: Secondary | ICD-10-CM

## 2011-07-27 DIAGNOSIS — I059 Rheumatic mitral valve disease, unspecified: Secondary | ICD-10-CM | POA: Insufficient documentation

## 2011-07-27 DIAGNOSIS — I10 Essential (primary) hypertension: Secondary | ICD-10-CM | POA: Insufficient documentation

## 2011-07-27 DIAGNOSIS — R42 Dizziness and giddiness: Secondary | ICD-10-CM

## 2011-07-27 DIAGNOSIS — I517 Cardiomegaly: Secondary | ICD-10-CM | POA: Insufficient documentation

## 2011-09-14 ENCOUNTER — Ambulatory Visit (HOSPITAL_BASED_OUTPATIENT_CLINIC_OR_DEPARTMENT_OTHER): Payer: Medicaid Other | Admitting: Lab

## 2011-09-14 ENCOUNTER — Encounter: Payer: Self-pay | Admitting: Physician Assistant

## 2011-09-14 ENCOUNTER — Ambulatory Visit (HOSPITAL_BASED_OUTPATIENT_CLINIC_OR_DEPARTMENT_OTHER): Payer: Medicaid Other | Admitting: Physician Assistant

## 2011-09-14 ENCOUNTER — Telehealth: Payer: Self-pay | Admitting: Oncology

## 2011-09-14 VITALS — BP 118/71 | HR 72 | Temp 98.4°F | Ht 65.0 in | Wt 155.7 lb

## 2011-09-14 DIAGNOSIS — D509 Iron deficiency anemia, unspecified: Secondary | ICD-10-CM

## 2011-09-14 DIAGNOSIS — D569 Thalassemia, unspecified: Secondary | ICD-10-CM

## 2011-09-14 DIAGNOSIS — Z853 Personal history of malignant neoplasm of breast: Secondary | ICD-10-CM

## 2011-09-14 DIAGNOSIS — IMO0001 Reserved for inherently not codable concepts without codable children: Secondary | ICD-10-CM

## 2011-09-14 LAB — CBC WITH DIFFERENTIAL/PLATELET
BASO%: 1 % (ref 0.0–2.0)
EOS%: 2.3 % (ref 0.0–7.0)
LYMPH%: 34.5 % (ref 14.0–49.7)
MCHC: 31.5 g/dL (ref 31.5–36.0)
MONO#: 0.6 10*3/uL (ref 0.1–0.9)
Platelets: 243 10*3/uL (ref 145–400)
RBC: 5.18 10*6/uL (ref 3.70–5.45)
WBC: 7.4 10*3/uL (ref 3.9–10.3)
lymph#: 2.6 10*3/uL (ref 0.9–3.3)
nRBC: 0 % (ref 0–0)

## 2011-09-14 MED ORDER — PRENATAL MULTIVITAMIN CH: 1.0000 | ORAL_TABLET | Freq: Every day | ORAL | Status: DC

## 2011-09-14 NOTE — Telephone Encounter (Signed)
gve the pt her June 2013 appt calendar. Per pt she will call us back to schedule her 6 month f/u since she lives out of town

## 2011-09-14 NOTE — Progress Notes (Signed)
ID: Andrea [name]   DOB: [birth year]  MR#: [id number]  UVO#:[id number]  HISTORY OF PRESENT ILLNESS: 31 year old [place] woman, followed for a history of iron deficiency anemia and thalassemia. Patient is intolerant of oral iron preparations. She has received Venofer through our office as needed in the past, most recently in January 2013.  INTERVAL HISTORY: Andrea [name] returns today for routine followup of her anemia, iron deficiency, and thalassemia. Interval history is remarkable for her having seen a rheumatologist since her appointment here in December, and she was diagnosed with fibromyalgia. She is also being followed by her primary care physician for vitamin D deficiency, hypothyroidism, and migraines. They're planning for her to meet with an endocrinologist and neurologist in the next few weeks. She also sees Dr. Elease Hashimoto for mitral valve prolapse, and in fact is scheduled to see him again next week on June 7.  Overall, Andrea [name]'s energy level is "pretty good". Her biggest plan continues to be diffuse pain, primarily the joints and muscles. 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 Again this is being followed closely as noted above. She denies any signs of abnormal bleeding. She's not having menstrual cycles. She has continued on prenatal vitamins, and needs a refill today.  Amee's husband, Andrea [name], is in the [employer], and has recently been stationed in [place]. He will be leaving [place] in July, and [place] plans to follow him within the next few months.  REVIEW OF SYSTEMS: Momina denies any known fevers, chills, or night sweats. She has no nausea or change in bowel habits. No cough, phlegm production, shortness of breath, or chest pain. No change in vision. She does feel little dizzy at times, occasionally lightheaded. No significant peripheral swelling.  A detailed review of systems is otherwise noncontributory.   PAST MEDICAL HISTORY: Past Medical History  Diagnosis Date    . Anemia   . Anxiety   . Arthritis   . Kidney stones   . Beta thalassemia major     dx 10 years ago, Dr. Darnelle Catalan  . Mitral regurgitation due to cusp prolapse   . Hypertension   . MVP (mitral valve prolapse)   . Heart murmur   . Anemia, iron deficiency 04/05/2011  . Thalassemia 04/05/2011    PAST SURGICAL HISTORY: Past Surgical History  Procedure Date  . Ovarian cyst removal     rupture  . Knee surgery     left x 2  . Tonsillectomy   . Tubal ligation     FAMILY HISTORY Family History  Problem Relation Age of Onset  . Diabetes Father   . Hypertension Father   . 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    ADVANCED DIRECTIVES: Not in place  HEALTH MAINTENANCE: History  Substance Use Topics  . Smoking status: Former Smoker    Types: Cigarettes    Quit date: 04/16/2000  . Smokeless tobacco: Never Used  . Alcohol Use: 2.4 oz/week    4 Glasses of wine per week     1-2 per week     Colonoscopy: Sigmoidoscopy 2012, Dr. Leone Payor  PAP: UTD  Bone density: never  Lipid panel: Followed by PCP  Allergies  Allergen Reactions  . Stadol (Butorphanol Tartrate)     hallucinations  . Tributyl Phosphate     tributaline- hallucinations    Current Outpatient Prescriptions  Medication Sig  Dispense Refill  . cholecalciferol (VITAMIN D) 400 UNITS TABS Take 5,000 Units by mouth daily.      Marland Kitchen etonogestrel-ethinyl estradiol (NUVARING) 0.12-0.015 MG/24HR vaginal ring Place 1 each vaginally every 28 (twenty-eight) days. Insert vaginally and leave in place for 3 consecutive weeks, then remove for 1 week.       . levothyroxine (SYNTHROID, LEVOTHROID) 50 MCG tablet Take 50 mcg by mouth daily.      . Prenatal Vit-Fe Fumarate-FA (PRENATAL MULTIVITAMIN) TABS Take 1 tablet by mouth daily.  30 tablet  5    OBJECTIVE: Young [place] American female who appears  comfortable and in no acute distress. Filed Vitals:   09/14/11 1339  BP: 118/71  Pulse: 72  Temp: 98.4 F (36.9 C)     Body mass index is 25.91 kg/(m^2).    ECOG FS: 1  Filed Weights   09/14/11 1339  Weight: 155 lb 11.2 oz (70.625 kg)   Physical Exam: HEENT:  Sclerae anicteric, conjunctivae pink.  Oropharynx clear.     Nodes:  No cervical, supraclavicular, or axillary lymphadenopathy palpated.  Breast Exam:  Deferred Lungs:  Clear to auscultation bilaterally.  No crackles, rhonchi, or wheezes.   Heart:  Regular rate and rhythm.  Systolic murmer auscultated. Abdomen:  Soft, nontender.  Positive bowel sounds.  No organomegaly or masses palpated.   Musculoskeletal:  No focal spinal tenderness to palpation.  Extremities:  Benign.  No peripheral edema or cyanosis.   Skin:  Benign.   Neuro:  Nonfocal. Alert and oriented x 3.    LAB RESULTS: Lab Results  Component Value Date   WBC 7.8 06/28/2011   NEUTROABS 4.5 06/28/2011   HGB 11.4* 06/28/2011   HCT 35.2 06/28/2011   MCV 68.6* 06/28/2011   PLT 256 06/28/2011      Chemistry      Component Value Date/Time   NA 138 06/28/2011 1515   K 3.3* 06/28/2011 1515   CL 104 06/28/2011 1515   CO2 26 06/28/2011 1515   BUN 8 06/28/2011 1515   CREATININE 0.84 06/28/2011 1515      Component Value Date/Time   CALCIUM 8.8 06/28/2011 1515   ALKPHOS 55 06/28/2011 1515   AST 16 06/28/2011 1515   ALT 10 06/28/2011 1515   BILITOT 0.6 06/28/2011 1515     Ferritin was 32 in March 2013.  CBC, CMET and Ferritin  Were all repeated today, 09/14/2011, with results pending.  STUDIES: No results found.  ASSESSMENT: A 31 year old [place] woman   (1)  history of iron deficiency anemia as well as thalassemia, intolerant of oral iron preparations.   (2) History of Venofer received through our office PRN, most recently in January 2013.  (3)  Comorbidities include fibromyalgia, hypothyroidism, mitral valve prolapse, and migraines.  PLAN: We will obtain  labs as Victorino Dike leaves today, including a CBC, CMET, and ferritin level. If her ferritin level is low, we will schedule her for additional Venofer.  Otherwise she'll return for routine followup here in 6 months. If she does, in fact, relocated to [place], and decides to establish yourself with a physician there, we will gladly forward her records per her request.  Of course she will continue to follow with her other physicians, her PCP, rheumatologist, and cardiologist, with regards to her comorbidities.  Due to her family history of breast cancer, Saide is especially concerned about her genetic risk. She has requested a referral to our genetic counselor and is interested in discussing BRCA testing.   Ruhaan Nordahl  09/14/2011   

## 2011-09-15 LAB — COMPREHENSIVE METABOLIC PANEL
ALT: 11 U/L (ref 0–35)
AST: 15 U/L (ref 0–37)
Alkaline Phosphatase: 56 U/L (ref 39–117)
Calcium: 9.1 mg/dL (ref 8.4–10.5)
Chloride: 108 mEq/L (ref 96–112)
Creatinine, Ser: 0.89 mg/dL (ref 0.50–1.10)
Total Bilirubin: 0.5 mg/dL (ref 0.3–1.2)

## 2011-09-21 ENCOUNTER — Encounter: Payer: Self-pay | Admitting: Cardiovascular Disease

## 2011-09-21 ENCOUNTER — Ambulatory Visit (INDEPENDENT_AMBULATORY_CARE_PROVIDER_SITE_OTHER): Payer: Medicaid Other | Admitting: Cardiovascular Disease

## 2011-09-21 VITALS — BP 100/68 | HR 74 | Resp 18 | Ht 65.0 in | Wt 154.8 lb

## 2011-09-21 DIAGNOSIS — I341 Nonrheumatic mitral (valve) prolapse: Secondary | ICD-10-CM

## 2011-09-21 DIAGNOSIS — R42 Dizziness and giddiness: Secondary | ICD-10-CM

## 2011-09-21 DIAGNOSIS — I059 Rheumatic mitral valve disease, unspecified: Secondary | ICD-10-CM

## 2011-09-21 NOTE — Progress Notes (Signed)
Andrea [name] Date of Birth  09/30/1980 [facility]     [place] Office  [address]    [address]   [address] [place], [place]  [address]    [place], [place]  [address] [phone]  Fax  [phone]  [phone]  Fax [phone]  Problems: 1. Mitral Valve Prolapse 2. Dizziness 3. Thalassemia- iron deficient anemia.   History of Present Illness:  Andrea [name] is a 31 y.o. with MVP.  She presented 2 months ago  with symptoms of dizziness.  She has these episodes at various times. She had one severe episode while driving. It was described as a very slow motion sensation that the room was spinning. She also these episodes while standing and sitting.   She has the sensation that she spinning.  These episodes started in November or December but not particularly worsened but because of their persistence she presents today for further evaluation. They're not necessarily related to turning of her head. They're not orthostatic in nature   These episodes of dizziness have resolved. She is overall doing quite well.    Current Outpatient Prescriptions on File Prior to Visit  Medication Sig Dispense Refill  . cholecalciferol (VITAMIN D) 400 UNITS TABS Take 5,000 Units by mouth daily.      Marland Kitchen etonogestrel-ethinyl estradiol (NUVARING) 0.12-0.015 MG/24HR vaginal ring Place 1 each vaginally every 28 (twenty-eight) days. Insert vaginally and leave in place for 3 consecutive weeks, then remove for 1 week.       . Prenatal Vit-Fe Fumarate-FA (PRENATAL MULTIVITAMIN) TABS Take 1 tablet by mouth daily.  30 tablet  5  . levothyroxine (SYNTHROID, LEVOTHROID) 50 MCG tablet Take 50 mcg by mouth daily.        Allergies  Allergen Reactions  . Stadol (Butorphanol Tartrate)     hallucinations  . Tributyl Phosphate     tributaline- hallucinations    Past Medical History  Diagnosis Date  . Anemia   . Anxiety   . Arthritis   . Kidney stones   . Beta thalassemia major     dx 10  years ago, Dr. Darnelle Catalan  . Mitral regurgitation due to cusp prolapse   . Hypertension   . MVP (mitral valve prolapse)   . Heart murmur   . Anemia, iron deficiency 04/05/2011  . Thalassemia 04/05/2011    Past Surgical History  Procedure Date  . Ovarian cyst removal     rupture  . Knee surgery     left x 2  . Tonsillectomy   . Tubal ligation     History  Smoking status  . Former Smoker  . Types: Cigarettes  . Quit date: 04/16/2000  Smokeless tobacco  . Never Used    History  Alcohol Use  . 2.4 oz/week  . 4 Glasses of wine per week    1-2 per week    Family History  Problem Relation Age of Onset  . Diabetes Father   . Hypertension Father   . Breast cancer Mother     Reviw of Systems:  Reviewed in the HPI.  All other systems are negative.  Physical Exam: Blood pressure 100/68, pulse 74, resp. rate 18, height 5\' 5"  (1.651 m), weight 154 lb 12.8 oz (70.217 kg). General: Well developed, well nourished, in no acute distress.  Head: Normocephalic, atraumatic, sclera non-icteric, mucus membranes are moist,   Neck: Supple. Carotids are 2 + without bruits. No JVD  Lungs: Clear bilaterally to auscultation.  Heart: regular  rate.  normal  S1 S2. She has a 3/6 late peaking systolic murmur at the left sternal border radiating to the axilla. PMI is  nondisplaced.  Abdomen: Soft, non-tender, non-distended with normal bowel sounds. No hepatomegaly. No rebound/guarding. No masses.  She has a surgical scar in her midline.  Msk:  Strength and tone are normal  Extremities: No clubbing or cyanosis. No edema.  Distal pedal pulses are 2+ and equal bilaterally.  Neuro: Alert and oriented X 3. Moves all extremities spontaneously.  Psych:  Responds to questions appropriately with a normal affect.  ECG: 07/17/2011-normal sinus rhythm at 80 beats a minute. She has no ST or T wave changes.  Assessment / Plan:

## 2011-09-21 NOTE — Assessment & Plan Note (Addendum)
Andrea [name] seems to be doing very well. She's not had any further episodes of chest pain or shortness breath. She's been able to do all of her normal activities without any significant problems. Her event monitor did not reveal any significant arrhythmias.  I'll see her again in one year. Her mitral valve prolapse is stable.

## 2011-09-21 NOTE — Assessment & Plan Note (Signed)
She has mitral valve prolapse which seems to be stable. We'll continue with her same medications. We'll plan on getting another echocardiogram in the next several years.

## 2011-09-21 NOTE — Patient Instructions (Signed)
Your physician wants you to follow-up in: 1 year. You will receive a reminder letter in the mail two months in advance. If you don't receive a letter, please call our office to schedule the follow-up appointment.  

## 2011-09-24 ENCOUNTER — Ambulatory Visit (HOSPITAL_BASED_OUTPATIENT_CLINIC_OR_DEPARTMENT_OTHER): Payer: Medicaid Other | Admitting: Genetic Counselor

## 2011-09-24 ENCOUNTER — Other Ambulatory Visit: Payer: Medicaid Other

## 2011-09-24 DIAGNOSIS — IMO0002 Reserved for concepts with insufficient information to code with codable children: Secondary | ICD-10-CM

## 2011-09-24 DIAGNOSIS — Z803 Family history of malignant neoplasm of breast: Secondary | ICD-10-CM

## 2011-09-24 DIAGNOSIS — D569 Thalassemia, unspecified: Secondary | ICD-10-CM

## 2011-09-24 NOTE — Progress Notes (Signed)
Andrea [name], a 31 y.o. female, came in for a discussion of her family history of breast cancer. She presents to clinic today, with her fiance, to discuss the possibility of a genetic predisposition to cancer, and to further clarify her risks, as well as her family members' risks for cancer.   HISTORY OF PRESENT ILLNESS: Andrea [name] is a 32 y.o. female with no personal history of cancer.    Past Medical History  Diagnosis Date  . Anemia   . Anxiety   . Arthritis   . Kidney stones   . Beta thalassemia major     dx 10 years ago, Dr. Darnelle Catalan  . Mitral regurgitation due to cusp prolapse   . Hypertension   . MVP (mitral valve prolapse)   . Heart murmur   . Anemia, iron deficiency 04/05/2011  . Thalassemia 04/05/2011    Past Surgical History  Procedure Date  . Ovarian cyst removal     rupture  . Knee surgery     left x 2  . Tonsillectomy   . Tubal ligation     History  Substance Use Topics  . Smoking status: Former Smoker    Types: Cigarettes    Quit date: 04/16/2000  . Smokeless tobacco: Never Used  . Alcohol Use: 2.4 oz/week    4 Glasses of wine per week     1-2 per week    REPRODUCTIVE HISTORY AND PERSONAL RISK ASSESSMENT FACTORS: Menarche was at age 25.   Premenopausal Uterus Intact: Yes Ovaries Intact: Yes [id number] , first live birth at age 74  She has not previously undergone treatment for infertility.   OCP use for 2-3 years   She has not used HRT in the past.    FAMILY HISTORY:  We obtained a detailed, 4-generation family history.  Significant diagnoses are listed below: Family History  Problem Relation Age of Onset  . Diabetes Father   . Hypertension Father   . Breast cancer Mother   [name]'s mother was diagnosed with breast cancer at age 42 and died at 49.  The patient was diagnosed with beta thalassemia, which her mother had as well, and possibly her oldest brother.  She does not think that any of her children are affected.  There is  no other reported cancer history on either side of the family.    Patient's maternal ancestors are of [place] descent, and paternal ancestors are of [place], [place] and [place] descent. There is no reported Ashkenazi Jewish ancestry. There is no  known consanguinity.  GENETIC COUNSELING RISK ASSESSMENT, DISCUSSION, AND SUGGESTED FOLLOW UP: We reviewed the natural history and genetic etiology of sporadic, familial and hereditary cancer syndromes.  About 5-10% of breast cancer is hereditary.  Of this, about 85% is the result of a BRCA1 or BRCA2 mutation.  We reviewed the red flags of hereditary cancer syndromes and the dominant inheritance patterns.  The patient's mother has several maternal half siblings who do not have cancer, however, there is a limited history on her father's side of the family. We discussed that her mother would have qualified for genetic testing based on her age, and some insurance companies would cover the patient based on that, however, [place] Medicaid does not pay for genetic testing.  The patient's family history is suggestive of the following possible diagnosis: hereditary cancer syndrome  We discussed that identification of a hereditary cancer syndrome may help her care providers tailor the patients medical management.  If a mutation indicating hereditary cancer syndrome is detected in this case, the [employer] recommendations would include increased cancer surveillance and possibly prophylactic sugery. If a mutation is detected, the patient will be referred back to the referring provider and to any additional appropriate care providers to discuss the relevant options.   If a mutation is not found in the patient cancer surveillance options would be discussed for the patient according to the appropriate standard National Comprehensive Cancer Network and American Cancer Society guidelines, with consideration of their  personal and family history risk factors. In this case, the patient will be referred back to their care providers for discussions of management.   In order to estimate her chance of having a BRCA1 or BRCA2 mutation, we used statistical models (Penn II) and laboratory data that take into account her personal medical history, family history and ancestry.  Because each model is different, there can be a lot of variability in the risks they give.  Therefore, these numbers must be considered a rough range and not a precise risk of having a BRCA1 or BRCA2 mutation.  These models estimate that she has approximately a 5% chance of having a mutation. Based on this assessment of her family and personal history, genetic testing is recommended.  After considering the risks, benefits, and limitations, the patient does not qualify for genetic testing based on her insurance coverage.  The patient was seen for a total of 30 minutes, greater than 50% of which was spent face-to-face counseling.  This plan is being carried out per Dr. Milta Deiters recommendations.  This note will also be sent to the referring provider via the electronic medical record. The patient will be supplied with a summary of this genetic counseling discussion as well as educational information on the discussed hereditary cancer syndromes following the conclusion of their visit.   Patient was discussed with Dr. Drue Second. _______________________________________________________________________ For Office Staff:  Number of people involved in session: 2 Was an Intern/ student involved with case: not applicable

## 2011-10-09 ENCOUNTER — Encounter: Payer: Self-pay | Admitting: Genetic Counselor

## 2012-05-12 ENCOUNTER — Telehealth: Payer: Self-pay | Admitting: *Deleted

## 2012-07-02 ENCOUNTER — Other Ambulatory Visit: Payer: Self-pay | Admitting: *Deleted

## 2012-07-02 DIAGNOSIS — D509 Iron deficiency anemia, unspecified: Secondary | ICD-10-CM

## 2012-07-07 ENCOUNTER — Other Ambulatory Visit: Payer: Self-pay | Admitting: Lab

## 2012-07-10 ENCOUNTER — Ambulatory Visit (HOSPITAL_BASED_OUTPATIENT_CLINIC_OR_DEPARTMENT_OTHER): Payer: 59

## 2012-07-10 ENCOUNTER — Encounter: Payer: Self-pay | Admitting: Physician Assistant

## 2012-07-10 ENCOUNTER — Ambulatory Visit (HOSPITAL_BASED_OUTPATIENT_CLINIC_OR_DEPARTMENT_OTHER): Payer: 59 | Admitting: Lab

## 2012-07-10 ENCOUNTER — Ambulatory Visit (HOSPITAL_BASED_OUTPATIENT_CLINIC_OR_DEPARTMENT_OTHER): Payer: 59 | Admitting: Physician Assistant

## 2012-07-10 VITALS — BP 128/79 | HR 94 | Temp 98.0°F | Resp 20

## 2012-07-10 VITALS — BP 107/77 | HR 96 | Temp 98.8°F | Resp 10 | Ht 65.0 in | Wt 159.3 lb

## 2012-07-10 DIAGNOSIS — D509 Iron deficiency anemia, unspecified: Secondary | ICD-10-CM

## 2012-07-10 DIAGNOSIS — D569 Thalassemia, unspecified: Secondary | ICD-10-CM

## 2012-07-10 DIAGNOSIS — E876 Hypokalemia: Secondary | ICD-10-CM

## 2012-07-10 LAB — CBC WITH DIFFERENTIAL/PLATELET
Basophils Absolute: 0.1 10*3/uL (ref 0.0–0.1)
EOS%: 4.1 % (ref 0.0–7.0)
Eosinophils Absolute: 0.3 10*3/uL (ref 0.0–0.5)
HCT: 35 % (ref 34.8–46.6)
HGB: 11.1 g/dL — ABNORMAL LOW (ref 11.6–15.9)
LYMPH%: 27.3 % (ref 14.0–49.7)
MCH: 22.1 pg — ABNORMAL LOW (ref 25.1–34.0)
MCV: 69.5 fL — ABNORMAL LOW (ref 79.5–101.0)
MONO%: 7.7 % (ref 0.0–14.0)
NEUT%: 60 % (ref 38.4–76.8)
Platelets: 239 10*3/uL (ref 145–400)

## 2012-07-10 LAB — COMPREHENSIVE METABOLIC PANEL (CC13)
Alkaline Phosphatase: 62 U/L (ref 40–150)
BUN: 10.9 mg/dL (ref 7.0–26.0)
Creatinine: 0.9 mg/dL (ref 0.6–1.1)
Glucose: 118 mg/dl — ABNORMAL HIGH (ref 70–99)
Total Bilirubin: 0.36 mg/dL (ref 0.20–1.20)

## 2012-07-10 MED ORDER — POTASSIUM CHLORIDE CRYS ER 20 MEQ PO TBCR: 20.0000 meq | EXTENDED_RELEASE_TABLET | Freq: Every day | ORAL | Status: DC

## 2012-07-10 MED ORDER — SODIUM CHLORIDE 0.9 % IV SOLN
INTRAVENOUS | Status: DC
  Administered 2012-07-10: 16:00:00 via INTRAVENOUS

## 2012-07-10 MED ORDER — POTASSIUM CHLORIDE CRYS ER 20 MEQ PO TBCR: 20.0000 meq | EXTENDED_RELEASE_TABLET | Freq: Two times a day (BID) | ORAL | Status: DC

## 2012-07-10 MED ORDER — SODIUM CHLORIDE 0.9 % IV SOLN
1020.0000 mg | Freq: Once | INTRAVENOUS | Status: AC
  Administered 2012-07-10: 1020 mg via INTRAVENOUS
  Filled 2012-07-10: qty 34

## 2012-07-10 NOTE — Progress Notes (Signed)
ID: Andrea [name]   DOB: Dec 10, 1980  MR#: [id number]  ONG#:[id number]  HISTORY OF PRESENT ILLNESS: 32 year old [place] woman, followed for a history of iron deficiency anemia and thalassemia. Patient is intolerant of oral iron preparations. She has received Venofer through our office as needed in the past, most recently in January 2013.  INTERVAL HISTORY: Andrea [name] returns today for routine followup of her anemia, iron deficiency, and thalassemia. She has relocated with her husband Andrea [name] to [place]. He is in [employer], and they will likely be in [place] until 2016.   Overall, Andrea [name] is feeling well. Her energy level is good. She denies any abnormal bleeding whatsoever. She still having regular menstrual cycles every 28 days. These last 5-6 days, and are slightly heavy. She is followed by gynecologist in [place].  Interval history is remarkable for Andrea [name] having been evaluated for an enlarged thyroid. Per her report, she had an ultrasound of the thyroid which showed "nodules".  These have been biopsied by fine needle aspiration on 2 occasions, with the pathology being "inconclusive" both times. Her doctor in [place] has recommended a thyroidectomy, but Andrea [name] is a little hesitant to proceed. She has requested that they send all of her records to [place] so that Dr. Darnelle Catalan might review of the ultrasound and the biopsy.  REVIEW OF SYSTEMS: Andrea [name] denies any recent illnesses and has had no known fevers, chills, or night sweats. She has no nausea or change in bowel habits. No cough, phlegm production, shortness of breath, or chest pain. No abnormal headaches, dizziness, or change in vision. No significant peripheral swelling. She continues to have diffuse joint pain which is followed by a rheumatologist with a diagnosis of fibromyalgia.  A detailed review of systems is otherwise noncontributory.   PAST MEDICAL HISTORY: Past Medical History  Diagnosis Date  . Anemia   . Anxiety   .  Arthritis   . Kidney stones   . Beta thalassemia major     dx 10 years ago, Dr. Darnelle Catalan  . Mitral regurgitation due to cusp prolapse   . Hypertension   . MVP (mitral valve prolapse)   . Heart murmur   . Anemia, iron deficiency 04/05/2011  . Thalassemia 04/05/2011    PAST SURGICAL HISTORY: Past Surgical History  Procedure Laterality Date  . Ovarian cyst removal      rupture  . Knee surgery      left x 2  . Tonsillectomy    . Tubal ligation      FAMILY HISTORY Family History  Problem Relation Age of Onset  . Diabetes Father   . Hypertension Father   . 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    ADVANCED DIRECTIVES: Not in place  HEALTH MAINTENANCE: History  Substance Use Topics  . Smoking status: Former Smoker    Types: Cigarettes    Quit date: 04/16/2000  . Smokeless tobacco: Never Used  . Alcohol Use: 2.4 oz/week    4 Glasses of wine per week     Comment: 1-2 per week     Colonoscopy: Sigmoidoscopy 2012, Dr. Leone Payor  PAP: UTD  Bone density: never  Lipid panel: Followed by PCP  Allergies  Allergen Reactions  . Stadol (Butorphanol Tartrate)     hallucinations  . Tributyl Phosphate     tributaline- hallucinations    Current Outpatient Prescriptions  Medication Sig Dispense Refill  . ALPRAZolam (XANAX) 0.5 MG tablet Take 0.5 mg by mouth at bedtime as needed for sleep.      . Etonogestrel-Ethinyl Estradiol (NUVARING VA) Place vaginally.      . Hydrocodone-Acetaminophen (VICODIN PO) Take by mouth as needed.      . potassium chloride SA (K-DUR,KLOR-CON) 20 MEQ tablet Take 1 tablet (20 mEq total) by mouth daily.  30 tablet  3   No current facility-administered medications for this visit.   Facility-Administered Medications Ordered in Other Visits  Medication Dose Route Frequency Provider Last Rate Last Dose  . 0.9 %  sodium  chloride infusion   Intravenous Continuous Lowella Dell, MD 50 mL/hr at 07/10/12 1545    . ferumoxytol (FERAHEME) 1,020 mg in sodium chloride 0.9 % 100 mL IVPB  1,020 mg Intravenous Once Catalina Gravel, PA-C   1,020 mg at 07/10/12 1559    OBJECTIVE: Andrea [name] who appears comfortable and in no acute distress. Filed Vitals:   07/10/12 0921  BP: 107/77  Pulse: 96  Temp: 98.8 F (37.1 C)  Resp: 10     Body mass index is 26.51 kg/(m^2).    ECOG FS: 1  Filed Weights   07/10/12 0921  Weight: 159 lb 4.8 oz (72.258 kg)   Physical Exam: HEENT:  Sclerae anicteric, conjunctivae pink.  Oropharynx clear.     Nodes:  No cervical, supraclavicular, or axillary lymphadenopathy palpated.  Breast Exam:  Deferred Lungs:  Clear to auscultation bilaterally.  No crackles, rhonchi, or wheezes.   Heart:  Regular rate and rhythm.  Systolic murmer auscultated. Abdomen:  Soft, nontender.  Positive bowel sounds.   Musculoskeletal:  No focal spinal tenderness to palpation.  Extremities:  Benign.  No peripheral edema.   Neuro:  Nonfocal. Well oriented with positive affect.    LAB RESULTS: Lab Results  Component Value Date   WBC 8.1 07/10/2012   NEUTROABS 4.9 07/10/2012   HGB 11.1* 07/10/2012   HCT 35.0 07/10/2012   MCV 69.5* 07/10/2012   PLT 239 07/10/2012      Chemistry      Component Value Date/Time   NA 140 07/10/2012 0914   NA 139 09/14/2011 1409   K 3.3* 07/10/2012 0914   K 3.6 09/14/2011 1409   CL 109* 07/10/2012 0914   CL 108 09/14/2011 1409   CO2 23 07/10/2012 0914   CO2 22 09/14/2011 1409   BUN 10.9 07/10/2012 0914   BUN 8 09/14/2011 1409   CREATININE 0.9 07/10/2012 0914   CREATININE 0.89 09/14/2011 1409      Component Value Date/Time   CALCIUM 8.5 07/10/2012 0914   CALCIUM 9.1 09/14/2011 1409   ALKPHOS 62 07/10/2012 0914   ALKPHOS 56 09/14/2011 1409   AST 18 07/10/2012 0914   AST 15 09/14/2011 1409   ALT 14 07/10/2012 0914   ALT 11 09/14/2011 1409   BILITOT 0.36 07/10/2012  0914   BILITOT 0.5 09/14/2011 1409     Ferritin 6 07/10/2012    STUDIES: No results found.  ASSESSMENT: A 32 year old [place] woman   (1)  history of iron deficiency anemia as well as thalassemia, intolerant of oral iron preparations.   (2) History of Venofer received through our office PRN, most recently in January 2013.  (3)  Comorbidities include fibromyalgia, hypothyroidism, mitral valve prolapse, and migraines.  PLAN: Overall, Andrea [name] is doing well. We are going to give her a dose of Feraheme today for her iron deficiency anemia  with a ferritin of 6. I am also starting her on oral potassium, 20 mEq daily for hypokalemia.  She will be back in [place] approximately 6 months, and we will hope to repeat her labs and see her for a visit again at that time. This would be late September. In the meanwhile she'll continue to followup with her physicians in [place] as needed.  In the meanwhile, Andrea [name] is having her records faxed to [place] from [place] with regards to her recent thyroid ultrasound and biopsy. She would like Dr. Darnelle Catalan to review those results for a second opinion.   Andrea [name] voices understanding and agreement with this plan, and will call with any changes or problems.    [facility]    07/10/2012

## 2012-07-10 NOTE — Patient Instructions (Addendum)
Ferumoxytol injection What is this medicine? FERUMOXYTOL is an iron complex. Iron is used to make healthy red blood cells, which carry oxygen and nutrients throughout the body. This medicine is used to treat iron deficiency anemia in people with chronic kidney disease. This medicine may be used for other purposes; ask your health care provider or pharmacist if you have questions. What should I tell my health care provider before I take this medicine? They need to know if you have any of these conditions: -anemia not caused by low iron levels -high levels of iron in the blood -magnetic resonance imaging (MRI) test scheduled -an unusual or allergic reaction to iron, other medicines, foods, dyes, or preservatives -pregnant or trying to get pregnant -breast-feeding How should I use this medicine? This medicine is for infusion into a vein. It is given by a health care professional in a hospital or clinic setting. Talk to your pediatrician regarding the use of this medicine in children. Special care may be needed. Overdosage: If you think you've taken too much of this medicine contact a poison control center or emergency room at once. Overdosage: If you think you have taken too much of this medicine contact a poison control center or emergency room at once. NOTE: This medicine is only for you. Do not share this medicine with others. What if I miss a dose? It is important not to miss your dose. Call your doctor or health care professional if you are unable to keep an appointment. What may interact with this medicine? This medicine may interact with the following medications: -other iron products This list may not describe all possible interactions. Give your health care provider a list of all the medicines, herbs, non-prescription drugs, or dietary supplements you use. Also tell them if you smoke, drink alcohol, or use illegal drugs. Some items may interact with your medicine. What should I watch  for while using this medicine? Visit your doctor or healthcare professional regularly. Tell your doctor or healthcare professional if your symptoms do not start to get better or if they get worse. You may need blood work done while you are taking this medicine. You may need to follow a special diet. Talk to your doctor. Foods that contain iron include: whole grains/cereals, dried fruits, beans, or peas, leafy green vegetables, and organ meats (liver, kidney). What side effects may I notice from receiving this medicine? Side effects that you should report to your doctor or health care professional as soon as possible: -allergic reactions like skin rash, itching or hives, swelling of the face, lips, or tongue -breathing problems -changes in blood pressure -feeling faint or lightheaded, falls -fever or chills -flushing, sweating, or hot feelings -swelling of the ankles or feet Side effects that usually do not require medical attention (Report these to your doctor or health care professional if they continue or are bothersome.): -diarrhea -headache -nausea, vomiting -stomach pain This list may not describe all possible side effects. Call your doctor for medical advice about side effects. You may report side effects to FDA at 1-800-FDA-1088. Where should I keep my medicine? This drug is given in a hospital or clinic and will not be stored at home. NOTE: This sheet is a summary. It may not cover all possible information. If you have questions about this medicine, talk to your doctor, pharmacist, or health care provider.  2013, Elsevier/Gold Standard. (12/24/2007 9:48:25 PM)  

## 2012-07-17 ENCOUNTER — Encounter: Payer: Self-pay | Admitting: Physician Assistant

## 2012-07-17 NOTE — Progress Notes (Signed)
We received copies of Andrea [name]'s records from her endocrinologist in [place]. These have been reviewed with Dr. Darnelle Catalan with regards to her recent thyroid biopsies.  Dr. Darrall Dears suggestion is that she consider further followup with ultrasound in 3-6 months, and repeat the biopsy if there is any change. We can also arrange for a second opinion when she returns to [facility] proximally 6 months with Dr. Darnell Level.    I have spoken with [name] regarding these recommendations, and she voices her understanding and agreement.  [name], PA-C 07/17/2012

## 2012-07-29 ENCOUNTER — Encounter: Payer: Self-pay | Admitting: Oncology

## 2014-02-15 ENCOUNTER — Other Ambulatory Visit: Payer: Self-pay | Admitting: Nurse Practitioner

## 2014-06-04 NOTE — Telephone Encounter (Signed)
none

## 2015-02-04 ENCOUNTER — Telehealth: Payer: Self-pay | Admitting: Oncology

## 2015-02-04 NOTE — Telephone Encounter (Signed)
PATIENT LEFT MESSAGE REQUESTING APPOINTMENT FOR LAB/FU. PER PATIENT SHE HAS RECENTLY MOVED BACK TO STATE AND WOULD LIKE TO GET HER CARE RESTARTED. PATIENT LAST SEEN MARCH 2014. MESSAGE FORWARDED TO DESK NURSE. RETURNED CALL PATIENT AWARE.

## 2015-03-11 ENCOUNTER — Other Ambulatory Visit: Payer: Self-pay | Admitting: *Deleted

## 2015-03-11 ENCOUNTER — Telehealth: Payer: Self-pay | Admitting: Nurse Practitioner

## 2015-03-11 NOTE — Telephone Encounter (Signed)
Patient was here and we made appointments and printed a calendar per pof

## 2015-03-18 ENCOUNTER — Other Ambulatory Visit: Payer: Self-pay | Admitting: *Deleted

## 2015-03-18 DIAGNOSIS — D509 Iron deficiency anemia, unspecified: Secondary | ICD-10-CM

## 2015-03-21 ENCOUNTER — Ambulatory Visit (HOSPITAL_BASED_OUTPATIENT_CLINIC_OR_DEPARTMENT_OTHER)

## 2015-03-21 ENCOUNTER — Other Ambulatory Visit (HOSPITAL_BASED_OUTPATIENT_CLINIC_OR_DEPARTMENT_OTHER)

## 2015-03-21 ENCOUNTER — Telehealth: Payer: Self-pay | Admitting: Nurse Practitioner

## 2015-03-21 ENCOUNTER — Ambulatory Visit (HOSPITAL_BASED_OUTPATIENT_CLINIC_OR_DEPARTMENT_OTHER): Admitting: Nurse Practitioner

## 2015-03-21 ENCOUNTER — Encounter: Payer: Self-pay | Admitting: Nurse Practitioner

## 2015-03-21 VITALS — BP 119/70 | HR 88 | Temp 97.8°F | Resp 20 | Ht 65.0 in | Wt 172.5 lb

## 2015-03-21 VITALS — BP 105/67 | HR 88 | Temp 98.2°F | Resp 18

## 2015-03-21 DIAGNOSIS — D509 Iron deficiency anemia, unspecified: Secondary | ICD-10-CM

## 2015-03-21 DIAGNOSIS — D561 Beta thalassemia: Secondary | ICD-10-CM

## 2015-03-21 DIAGNOSIS — I341 Nonrheumatic mitral (valve) prolapse: Secondary | ICD-10-CM

## 2015-03-21 LAB — COMPREHENSIVE METABOLIC PANEL
ALK PHOS: 80 U/L (ref 40–150)
ALT: 20 U/L (ref 0–55)
AST: 24 U/L (ref 5–34)
Albumin: 3.9 g/dL (ref 3.5–5.0)
Anion Gap: 8 mEq/L (ref 3–11)
BUN: 12.2 mg/dL (ref 7.0–26.0)
CHLORIDE: 108 meq/L (ref 98–109)
CO2: 22 meq/L (ref 22–29)
Calcium: 9.2 mg/dL (ref 8.4–10.4)
Creatinine: 0.9 mg/dL (ref 0.6–1.1)
GLUCOSE: 90 mg/dL (ref 70–140)
POTASSIUM: 3.4 meq/L — AB (ref 3.5–5.1)
SODIUM: 139 meq/L (ref 136–145)
Total Bilirubin: 0.83 mg/dL (ref 0.20–1.20)
Total Protein: 7.9 g/dL (ref 6.4–8.3)

## 2015-03-21 LAB — CBC WITH DIFFERENTIAL/PLATELET
BASO%: 1.1 % (ref 0.0–2.0)
BASOS ABS: 0.1 10*3/uL (ref 0.0–0.1)
EOS ABS: 0.3 10*3/uL (ref 0.0–0.5)
EOS%: 3.5 % (ref 0.0–7.0)
HCT: 29.1 % — ABNORMAL LOW (ref 34.8–46.6)
HGB: 8.9 g/dL — ABNORMAL LOW (ref 11.6–15.9)
LYMPH%: 31.7 % (ref 14.0–49.7)
MCH: 19.7 pg — AB (ref 25.1–34.0)
MCHC: 30.4 g/dL — AB (ref 31.5–36.0)
MCV: 64.6 fL — AB (ref 79.5–101.0)
MONO#: 0.6 10*3/uL (ref 0.1–0.9)
MONO%: 7.1 % (ref 0.0–14.0)
NEUT#: 4.5 10*3/uL (ref 1.5–6.5)
NEUT%: 56.6 % (ref 38.4–76.8)
Platelets: 304 10*3/uL (ref 145–400)
RBC: 4.51 10*6/uL (ref 3.70–5.45)
RDW: 17 % — ABNORMAL HIGH (ref 11.2–14.5)
WBC: 8 10*3/uL (ref 3.9–10.3)
lymph#: 2.5 10*3/uL (ref 0.9–3.3)

## 2015-03-21 LAB — FERRITIN: Ferritin: 7 ng/ml — ABNORMAL LOW (ref 9–269)

## 2015-03-21 MED ORDER — SODIUM CHLORIDE 0.9 % IV SOLN
510.0000 mg | Freq: Once | INTRAVENOUS | Status: AC
  Administered 2015-03-21: 510 mg via INTRAVENOUS
  Filled 2015-03-21: qty 17

## 2015-03-21 MED ORDER — SODIUM CHLORIDE 0.9 % IJ SOLN
3.0000 mL | Freq: Once | INTRAMUSCULAR | Status: DC | PRN
  Filled 2015-03-21: qty 10

## 2015-03-21 MED ORDER — SODIUM CHLORIDE 0.9 % IV SOLN
Freq: Once | INTRAVENOUS | Status: AC
  Administered 2015-03-21: 14:00:00 via INTRAVENOUS

## 2015-03-21 NOTE — Patient Instructions (Signed)

## 2015-03-21 NOTE — Telephone Encounter (Signed)
Called patient with appointments and she will have to call me back tomorrow with her work schedule

## 2015-03-21 NOTE — Progress Notes (Signed)
ID: Andrea [name]   DOB: 03/27/1981  MR#: [id number]  JYN#:[id number]#:[id number]  CHIEF COMPLAINT: anemia  CURRENT TREATMENT: IV feraheme  HISTORY OF PRESENT ILLNESS: 34 year old [place] woman, followed for a history of iron deficiency anemia and thalassemia. Patient is intolerant of oral iron preparations. She has received Venofer through our office as needed in the past, most recently in January 2013.  INTERVAL HISTORY: Andrea [name] returns today for routine followup of her anemia, iron deficiency, and thalassemia. [place] has relocated to the area from [place] and would like to reestablish her care here. The last time she was given iron, was at this facility 2 over 2 years ago. She is symptomatically anemic today with complaints of shortness of breath, dizziness, and fatigue. Her last period was just 1 week ago, and they are fairly heavy. She is expecting a uterine ablation in February.  REVIEW OF SYSTEMS: Andrea [name] is having palpitations, and has history of mitral valve prolapse. She has chronic migraines. She denies swelling or abnormal bleeding. She has diffuse joint pain secondary to fibromyalgia. A detailed review of systems is otherwise stable.   PAST MEDICAL HISTORY: Past Medical History  Diagnosis Date  . Anemia   . Anxiety   . Arthritis   . Kidney stones   . Beta thalassemia major     dx 10 years ago, Dr. Darnelle CatalanMagrinat  . Mitral regurgitation due to cusp prolapse   . Hypertension   . MVP (mitral valve prolapse)   . Heart murmur   . Anemia, iron deficiency 04/05/2011  . Thalassemia 04/05/2011    PAST SURGICAL HISTORY: Past Surgical History  Procedure Laterality Date  . Ovarian cyst removal      rupture  . Knee surgery      left x 2  . Tonsillectomy    . Tubal ligation      FAMILY HISTORY Family History  Problem Relation Age of Onset  . Diabetes Father   . Hypertension Father   . Breast cancer Mother     GYNECOLOGIC HISTORY: GxP3  SOCIAL HISTORY: She is a homemaker and a  Consulting civil engineerstudent. Married, living with her  Spouse, Andrea PicketScott, who is in the [employer].    ADVANCED DIRECTIVES: Not in place  HEALTH MAINTENANCE: Social History  Substance Use Topics  . Smoking status: Former Smoker    Types: Cigarettes    Quit date: 04/16/2000  . Smokeless tobacco: Never Used  . Alcohol Use: 2.4 oz/week    4 Glasses of wine per week     Comment: 1-2 per week     Colonoscopy: Sigmoidoscopy 2012, Dr. Leone PayorGessner  PAP: UTD  Bone density: never  Lipid panel: Followed by PCP  Allergies  Allergen Reactions  . Stadol [Butorphanol Tartrate]     hallucinations  . Tributyl Phosphate     tributaline- hallucinations    Current Outpatient Prescriptions  Medication Sig Dispense Refill  . ALPRAZolam (XANAX) 0.5 MG tablet Take 0.5 mg by mouth at bedtime as needed for sleep.    Marland Kitchen. etonogestrel-ethinyl estradiol (NUVARING) 0.12-0.015 MG/24HR vaginal ring Place vaginally.     No current facility-administered medications for this visit.    OBJECTIVE: Young [place] female who appears comfortable and in no acute distress. Filed Vitals:   03/21/15 1259  BP: 119/70  Pulse: 88  Temp: 97.8 F (36.6 C)  Resp: 20     Body mass index is 28.71 kg/(m^2).    ECOG FS: 1  Filed Weights   03/21/15 1259  Weight: 172 lb  8 oz (78.245 kg)   Physical Exam: Skin: warm, dry  HEENT: sclerae anicteric, conjunctivae pink, oropharynx clear. No thrush or mucositis.  Lymph Nodes: No cervical or supraclavicular lymphadenopathy  Lungs: clear to auscultation bilaterally, no rales, wheezes, or rhonci  Heart: regular rate and rhythm, systolic murmur easily auscultated  Abdomen: round, soft, non tender, positive bowel sounds  Musculoskeletal: No focal spinal tenderness, no peripheral edema  Neuro: non focal, well oriented, positive affect  Breasts: deferred  LAB RESULTS: Lab Results  Component Value Date   WBC 8.0 03/21/2015   NEUTROABS 4.5 03/21/2015   HGB 8.9* 03/21/2015   HCT 29.1*  03/21/2015   MCV 64.6* 03/21/2015   PLT 304 03/21/2015      Chemistry      Component Value Date/Time   NA 139 03/21/2015 1240   NA 139 09/14/2011 1409   K 3.4* 03/21/2015 1240   K 3.6 09/14/2011 1409   CL 109* 07/10/2012 0914   CL 108 09/14/2011 1409   CO2 22 03/21/2015 1240   CO2 22 09/14/2011 1409   BUN 12.2 03/21/2015 1240   BUN 8 09/14/2011 1409   CREATININE 0.9 03/21/2015 1240   CREATININE 0.89 09/14/2011 1409      Component Value Date/Time   CALCIUM 9.2 03/21/2015 1240   CALCIUM 9.1 09/14/2011 1409   ALKPHOS 80 03/21/2015 1240   ALKPHOS 56 09/14/2011 1409   AST 24 03/21/2015 1240   AST 15 09/14/2011 1409   ALT 20 03/21/2015 1240   ALT 11 09/14/2011 1409   BILITOT 0.83 03/21/2015 1240   BILITOT 0.5 09/14/2011 1409     Ferritin 7 03/21/2015  STUDIES: No results found.  ASSESSMENT: A 34 y.o. [place] woman   (1)  history of iron deficiency anemia as well as thalassemia, intolerant of oral iron preparations.   (2) History of Venofer received through our office PRN, most recently in January 2013.  (3)  Comorbidities include fibromyalgia, hypothyroidism, mitral valve prolapse, and migraines.  PLAN: The labs were were reviewed in detail, and Andrea [name] was unsurprisingly anemic today with an hgb of 8.9, MVC 64.6. Her ferritin level returned at 7. She will proceed with the first of 2 weekly planned doses of feraheme today.  I have asked her to call Dr. Sallee Provencal office to reestablish herself in his clinic for follow up of her mitral valve prolapse and palpitations.   Andrea [name] will have her labs rechecked in 6 weeks. Of course her MCV will likely never be "normal" secondary to her beta thalassemia, but if they are trending in the right direction, she will return in 6 months for follow up with Dr. Darnelle Catalan. She understands and agrees with this plan. She has been encouraged to call with any issues that might arise before her next visit here.    Andrea [name]     03/21/2015

## 2015-03-23 ENCOUNTER — Telehealth: Payer: Self-pay | Admitting: *Deleted

## 2015-03-23 NOTE — Telephone Encounter (Signed)
Patient called and left message regarding her appt for Monday. I have called her back and gave her the appt for Monday

## 2015-03-28 ENCOUNTER — Other Ambulatory Visit: Payer: Self-pay | Admitting: Nurse Practitioner

## 2015-03-28 ENCOUNTER — Ambulatory Visit (HOSPITAL_BASED_OUTPATIENT_CLINIC_OR_DEPARTMENT_OTHER)

## 2015-03-28 VITALS — BP 111/64 | HR 81 | Temp 98.2°F | Resp 17

## 2015-03-28 DIAGNOSIS — D509 Iron deficiency anemia, unspecified: Secondary | ICD-10-CM | POA: Diagnosis not present

## 2015-03-28 DIAGNOSIS — M62838 Other muscle spasm: Secondary | ICD-10-CM

## 2015-03-28 MED ORDER — METHOCARBAMOL 500 MG PO TABS: 500.0000 mg | ORAL_TABLET | Freq: Three times a day (TID) | ORAL | Status: DC

## 2015-03-28 MED ORDER — SODIUM CHLORIDE 0.9 % IV SOLN
510.0000 mg | Freq: Once | INTRAVENOUS | Status: AC
  Administered 2015-03-28: 510 mg via INTRAVENOUS
  Filled 2015-03-28: qty 17

## 2015-03-28 MED ORDER — SODIUM CHLORIDE 0.9 % IV SOLN
Freq: Once | INTRAVENOUS | Status: AC
  Administered 2015-03-28: 15:00:00 via INTRAVENOUS

## 2015-03-28 NOTE — Patient Instructions (Signed)
Ferumoxytol injection °What is this medicine? °FERUMOXYTOL is an iron complex. Iron is used to make healthy red blood cells, which carry oxygen and nutrients throughout the body. This medicine is used to treat iron deficiency anemia in people with chronic kidney disease. °This medicine may be used for other purposes; ask your health care provider or pharmacist if you have questions. °What should I tell my health care provider before I take this medicine? °They need to know if you have any of these conditions: °-anemia not caused by low iron levels °-high levels of iron in the blood °-magnetic resonance imaging (MRI) test scheduled °-an unusual or allergic reaction to iron, other medicines, foods, dyes, or preservatives °-pregnant or trying to get pregnant °-breast-feeding °How should I use this medicine? °This medicine is for injection into a vein. It is given by a health care professional in a hospital or clinic setting. °Talk to your pediatrician regarding the use of this medicine in children. Special care may be needed. °Overdosage: If you think you have taken too much of this medicine contact a poison control center or emergency room at once. °NOTE: This medicine is only for you. Do not share this medicine with others. °What if I miss a dose? °It is important not to miss your dose. Call your doctor or health care professional if you are unable to keep an appointment. °What may interact with this medicine? °This medicine may interact with the following medications: °-other iron products °This list may not describe all possible interactions. Give your health care provider a list of all the medicines, herbs, non-prescription drugs, or dietary supplements you use. Also tell them if you smoke, drink alcohol, or use illegal drugs. Some items may interact with your medicine. °What should I watch for while using this medicine? °Visit your doctor or healthcare professional regularly. Tell your doctor or healthcare  professional if your symptoms do not start to get better or if they get worse. You may need blood work done while you are taking this medicine. °You may need to follow a special diet. Talk to your doctor. Foods that contain iron include: whole grains/cereals, dried fruits, beans, or peas, leafy green vegetables, and organ meats (liver, kidney). °What side effects may I notice from receiving this medicine? °Side effects that you should report to your doctor or health care professional as soon as possible: °-allergic reactions like skin rash, itching or hives, swelling of the face, lips, or tongue °-breathing problems °-changes in blood pressure °-feeling faint or lightheaded, falls °-fever or chills °-flushing, sweating, or hot feelings °-swelling of the ankles or feet °Side effects that usually do not require medical attention (Report these to your doctor or health care professional if they continue or are bothersome.): °-diarrhea °-headache °-nausea, vomiting °-stomach pain °This list may not describe all possible side effects. Call your doctor for medical advice about side effects. You may report side effects to FDA at 1-800-FDA-1088. °Where should I keep my medicine? °This drug is given in a hospital or clinic and will not be stored at home. °NOTE: This sheet is a summary. It may not cover all possible information. If you have questions about this medicine, talk to your doctor, pharmacist, or health care provider. °  °© 2016, Elsevier/Gold Standard. (2011-11-16 15:23:36) ° °Anemia, Nonspecific °Anemia is a condition in which the concentration of red blood cells or hemoglobin in the blood is below normal. Hemoglobin is a substance in red blood cells that carries oxygen to the tissues   of the body. Anemia results in not enough oxygen reaching these tissues.  °CAUSES  °Common causes of anemia include:  °· Excessive bleeding. Bleeding may be internal or external. This includes excessive bleeding from periods (in  women) or from the intestine.   °· Poor nutrition.   °· Chronic kidney, thyroid, and liver disease.  °· Bone marrow disorders that decrease red blood cell production. °· Cancer and treatments for cancer. °· HIV, AIDS, and their treatments. °· Spleen problems that increase red blood cell destruction. °· Blood disorders. °· Excess destruction of red blood cells due to infection, medicines, and autoimmune disorders. °SIGNS AND SYMPTOMS  °· Minor weakness.   °· Dizziness.   °· Headache. °· Palpitations.   °· Shortness of breath, especially with exercise.   °· Paleness. °· Cold sensitivity. °· Indigestion. °· Nausea. °· Difficulty sleeping. °· Difficulty concentrating. °Symptoms may occur suddenly or they may develop slowly.  °DIAGNOSIS  °Additional blood tests are often needed. These help your health care provider determine the best treatment. Your health care provider will check your stool for blood and look for other causes of blood loss.  °TREATMENT  °Treatment varies depending on the cause of the anemia. Treatment can include:  °· Supplements of iron, vitamin B12, or folic acid.   °· Hormone medicines.   °· A blood transfusion. This may be needed if blood loss is severe.   °· Hospitalization. This may be needed if there is significant continual blood loss.   °· Dietary changes. °· Spleen removal. °HOME CARE INSTRUCTIONS °Keep all follow-up appointments. It often takes many weeks to correct anemia, and having your health care provider check on your condition and your response to treatment is very important. °SEEK IMMEDIATE MEDICAL CARE IF:  °· You develop extreme weakness, shortness of breath, or chest pain.   °· You become dizzy or have trouble concentrating. °· You develop heavy vaginal bleeding.   °· You develop a rash.   °· You have bloody or black, tarry stools.   °· You faint.   °· You vomit up blood.   °· You vomit repeatedly.   °· You have abdominal pain. °· You have a fever or persistent symptoms for more  than 2-3 days.   °· You have a fever and your symptoms suddenly get worse.   °· You are dehydrated.   °MAKE SURE YOU: °· Understand these instructions. °· Will watch your condition. °· Will get help right away if you are not doing well or get worse. °  °This information is not intended to replace advice given to you by your health care provider. Make sure you discuss any questions you have with your health care provider. °  °Document Released: 05/10/2004 Document Revised: 12/03/2012 Document Reviewed: 09/26/2012 °Elsevier Interactive Patient Education ©2016 Elsevier Inc. ° ° °

## 2015-04-26 ENCOUNTER — Encounter: Admitting: Cardiovascular Disease

## 2015-04-27 ENCOUNTER — Encounter: Payer: Self-pay | Admitting: Cardiovascular Disease

## 2015-04-29 ENCOUNTER — Other Ambulatory Visit: Payer: Self-pay

## 2015-04-29 DIAGNOSIS — D509 Iron deficiency anemia, unspecified: Secondary | ICD-10-CM

## 2015-05-02 ENCOUNTER — Encounter: Payer: Self-pay | Admitting: Cardiovascular Disease

## 2015-05-02 ENCOUNTER — Other Ambulatory Visit

## 2015-09-19 ENCOUNTER — Ambulatory Visit: Admitting: Oncology

## 2015-09-19 ENCOUNTER — Other Ambulatory Visit

## 2016-11-27 ENCOUNTER — Telehealth: Payer: Self-pay | Admitting: Cardiovascular Disease

## 2016-11-27 NOTE — Telephone Encounter (Signed)
Did not need this encounter °

## 2016-12-24 ENCOUNTER — Encounter (INDEPENDENT_AMBULATORY_CARE_PROVIDER_SITE_OTHER): Payer: Self-pay

## 2016-12-24 ENCOUNTER — Encounter: Payer: Self-pay | Admitting: Cardiovascular Disease

## 2016-12-24 ENCOUNTER — Ambulatory Visit (INDEPENDENT_AMBULATORY_CARE_PROVIDER_SITE_OTHER): Admitting: Cardiovascular Disease

## 2016-12-24 VITALS — BP 120/78 | HR 86 | Ht 65.0 in | Wt 171.8 lb

## 2016-12-24 DIAGNOSIS — I341 Nonrheumatic mitral (valve) prolapse: Secondary | ICD-10-CM | POA: Diagnosis not present

## 2016-12-24 DIAGNOSIS — I493 Ventricular premature depolarization: Secondary | ICD-10-CM | POA: Diagnosis not present

## 2016-12-24 DIAGNOSIS — I340 Nonrheumatic mitral (valve) insufficiency: Secondary | ICD-10-CM

## 2016-12-24 NOTE — Patient Instructions (Signed)
Medication Instructions:  Your physician recommends that you continue on your current medications as directed. Please refer to the Current Medication list given to you today.   Labwork: TODAY - TSH, BMET   Testing/Procedures: None Ordered   Follow-Up: Your physician wants you to follow-up in: 1 year with Dr. Nahser. You will receive a reminder letter in the mail two months in advance. If you don't receive a letter, please call our office to schedule the follow-up appointment.   If you need a refill on your cardiac medications before your next appointment, please call your pharmacy.   Thank you for choosing [facility]! Nayelli Inglis, RN [phone]    

## 2016-12-24 NOTE — Progress Notes (Signed)
Andrea [name] Date of Birth  Dec 13, 1980 [facility]     [place] Office  [address]    [address]   [address] [place], [place]  [address]    [place], [place]  [address] [phone]  Fax  [phone]  [phone]  Fax [phone]  Problems: 1. Mitral Valve Prolapse 2. Dizziness 3. Thalassemia- iron deficient anemia.   History of Present Illness:  Andrea [name] is a 36 y.o. with MVP.  She presented 2 months ago  with symptoms of dizziness.  She has these episodes at various times. She had one severe episode while driving. It was described as a very slow motion sensation that the room was spinning. She also these episodes while standing and sitting.   She has the sensation that she spinning.  These episodes started in November or December but not particularly worsened but because of their persistence she presents today for further evaluation. They're not necessarily related to turning of her head. They're not orthostatic in nature   These episodes of dizziness have resolved. She is overall doing quite well.  Sept. 10, 2018:  Andrea [name] is seen today after a 6 year absence. Had moved to [facility], now back at [place] Had a recent episode of palpitations - felt like she was being kicked in the chest Lasted off and on for 3 weeks.  Went to the ER Potassium was found to be 3.2  Was given potassium replacement but did not get an ongoing script.  Has been exercising some     Current Outpatient Prescriptions on File Prior to Visit  Medication Sig Dispense Refill  . ALPRAZolam (XANAX) 0.5 MG tablet Take 0.5 mg by mouth at bedtime as needed for sleep.    Marland Kitchen etonogestrel-ethinyl estradiol (NUVARING) 0.12-0.015 MG/24HR vaginal ring Place 1 each vaginally every 28 (twenty-eight) days.     . methocarbamol (ROBAXIN) 500 MG tablet Take 1 tablet (500 mg total) by mouth 3 (three) times daily. 90 tablet 0   No current facility-administered medications on file prior to  visit.     Allergies  Allergen Reactions  . Other Anaphylaxis    Nuts- Anaphylaxis  Peel able fruit - Anaphylaxis Nuts- Anaphylaxis  Peel able fruit - Anaphylaxis  . Stadol [Butorphanol Tartrate]     hallucinations  . Terbutaline Other (See Comments)    Hallucinations   . Tributyl Phosphate     tributaline- hallucinations    Past Medical History:  Diagnosis Date  . Anemia   . Anemia, iron deficiency 04/05/2011  . Anxiety   . Arthritis   . Beta thalassemia major (HCC)    dx 10 years ago, Dr. Darnelle Catalan  . Heart murmur   . Hypertension   . Kidney stones   . Mitral regurgitation due to cusp prolapse   . MVP (mitral valve prolapse)   . Thalassemia 04/05/2011    Past Surgical History:  Procedure Laterality Date  . KNEE SURGERY     left x 2  . OVARIAN CYST REMOVAL     rupture  . TONSILLECTOMY    . TUBAL LIGATION      History  Smoking Status  . Former Smoker  . Types: Cigarettes  . Quit date: 04/16/2000  Smokeless Tobacco  . Never Used    History  Alcohol Use  . 2.4 oz/week  . 4 Glasses of wine per week    Comment: 1-2 per week    Family History  Problem Relation Age of Onset  .  Diabetes Father   . Hypertension Father   . Breast cancer Mother     Reviw of Systems:  Reviewed in the HPI.  All other systems are negative.  Physical Exam: Blood pressure 120/78, pulse 86, height 5\' 5"  (1.651 m), weight 171 lb 12.8 oz (77.9 kg). General: Well developed, well nourished, in no acute distress.  Head: Normocephalic, atraumatic, sclera non-icteric, mucus membranes are moist,   Neck: Supple. Carotids are 2 + without bruits. No JVD  Lungs: Clear bilaterally to auscultation.  Heart: regular rate.  normal  S1 S2. She has a 3/6 late peaking systolic murmur at the left sternal border radiating to the axilla. PMI is  nondisplaced.  Abdomen: Soft, non-tender, non-distended with normal bowel sounds. No hepatomegaly. No rebound/guarding. No masses.  She has a  surgical scar in her midline.  Msk:  Strength and tone are normal  Extremities: No clubbing or cyanosis. No edema.  Distal pedal pulses are 2+ and equal bilaterally.  Neuro: Alert and oriented X 3. Moves all extremities spontaneously.  Psych:  Responds to questions appropriately with a normal affect.  ECG: Sept. 10, 2018:  NSR at 86.  Normal ECG  Assessment / Plan:   1.  Mitral valve prolapse - with moderate MR Clinically stable Suggest repeating echo in a year or so  2. PVCs:   In the setting of hypokalemia:   Will check BMP and TSH today . She may need a potassium suppliment.   Will see her in 1 year,

## 2016-12-25 ENCOUNTER — Telehealth: Payer: Self-pay | Admitting: Nurse Practitioner

## 2016-12-25 LAB — BASIC METABOLIC PANEL
BUN / CREAT RATIO: 9 (ref 9–23)
BUN: 8 mg/dL (ref 6–20)
CO2: 21 mmol/L (ref 20–29)
CREATININE: 0.92 mg/dL (ref 0.57–1.00)
Calcium: 9.1 mg/dL (ref 8.7–10.2)
Chloride: 103 mmol/L (ref 96–106)
GFR calc Af Amer: 91 mL/min/{1.73_m2} (ref >59)
GFR, EST NON AFRICAN AMERICAN: 79 mL/min/{1.73_m2} (ref >59)
GLUCOSE: 90 mg/dL (ref 65–99)
Potassium: 3.6 mmol/L (ref 3.5–5.2)
Sodium: 140 mmol/L (ref 134–144)

## 2016-12-25 LAB — TSH: TSH: 1.18 u[IU]/mL (ref 0.450–4.500)

## 2016-12-25 MED ORDER — POTASSIUM CHLORIDE CRYS ER 20 MEQ PO TBCR: 20.0000 meq | EXTENDED_RELEASE_TABLET | Freq: Every day | ORAL | 3 refills | Status: DC

## 2016-12-25 NOTE — Telephone Encounter (Signed)
Reviewed results and plan of care with patient. She verbalized understanding and agreement and states she will get repeat [facility] [place] [facility] [place] and will call us back with those results. She thanked me for the call.

## 2016-12-25 NOTE — Telephone Encounter (Signed)
-----   Message from Vesta MixerPhilip J Nahser, MD sent at 12/25/2016  1:21 PM EDT ----- TSH is normal Potassium is low Start Kdur 20 meq a day  Recheck BMP  in 1 month ( she can have it checked locally and sent here if she would like )

## 2017-02-15 ENCOUNTER — Telehealth: Payer: Self-pay | Admitting: Cardiovascular Disease

## 2017-02-15 ENCOUNTER — Other Ambulatory Visit: Admitting: *Deleted

## 2017-02-15 DIAGNOSIS — E876 Hypokalemia: Secondary | ICD-10-CM | POA: Insufficient documentation

## 2017-02-15 LAB — BASIC METABOLIC PANEL
BUN/Creatinine Ratio: 8 — ABNORMAL LOW (ref 9–23)
BUN: 8 mg/dL (ref 6–20)
CALCIUM: 8.9 mg/dL (ref 8.7–10.2)
CO2: 20 mmol/L (ref 20–29)
CREATININE: 1.04 mg/dL — AB (ref 0.57–1.00)
Chloride: 105 mmol/L (ref 96–106)
GFR calc Af Amer: 79 mL/min/{1.73_m2} (ref >59)
GFR, EST NON AFRICAN AMERICAN: 68 mL/min/{1.73_m2} (ref >59)
Glucose: 92 mg/dL (ref 65–99)
Potassium: 4.6 mmol/L (ref 3.5–5.2)
Sodium: 140 mmol/L (ref 134–144)

## 2017-02-15 NOTE — Telephone Encounter (Signed)
New message     Patient calling to request lab order. Please call

## 2017-02-15 NOTE — Telephone Encounter (Signed)
Levi AlandSwinyer, Michelle M, RN      12/25/16 4:27 PM  Note    Reviewed results and plan of care with patient. She verbalized understanding and agreement and states she will get repeat [facility] [place] [facility] and will call us back with those results. She thanked me for the call.     Levi AlandSwinyer, Michelle M, RN      12/25/16 4:23 PM  Note    ----- Message from Vesta MixerPhilip J Nahser, MD sent at 12/25/2016  1:21 PM EDT ----- TSH is normal Potassium is low Start Kdur 20 meq a day  Recheck BMP  in 1 month ( she can have it checked locally and sent here if she would like )      As indicated above, the pt was to have a repeat BMET done in [place] around 10/11, for lab result on 9/11.   This is for adding med KDUR to regimen, per Dr Melburn PopperNasher.  Pt has now walked into the clinic to inform Dr Elease HashimotoNahser and RN that she did not get [place] [facility] [place] [facility], and is now requesting it to be done today.  Pt states she has been compliant with taking her KDUR, as advised by Dr Melburn PopperNasher.  Order for BMET placed and pt to have this drawn today.  Will forward this information to both Dr Elease HashimotoNahser and Marcelino DusterMichelle as an FYI and for further follow-up.

## 2017-02-18 ENCOUNTER — Telehealth: Payer: Self-pay | Admitting: Cardiovascular Disease

## 2017-02-18 DIAGNOSIS — R002 Palpitations: Secondary | ICD-10-CM

## 2017-02-18 NOTE — Telephone Encounter (Signed)
Attempted to call pt back.  VM picked up and said mailbox is full.  Will try again later.

## 2017-02-18 NOTE — Telephone Encounter (Signed)
Victorino DikeJennifer is calling to about  if her Lab results and the medication Potassium is not working . Please call

## 2017-02-19 NOTE — Telephone Encounter (Signed)
Reviewed lab results with patient who verbalized understanding. She states she has worsening palpitations that are really bothersome. She does not associate them with any other activity - she states they occur intermittently throughout the day and night. She does not monitor her HR or BP. She has not been prescribed any beta blockers. I advised that I will review her symptoms with Dr. Elease HashimotoNahser. I asked if it would be difficult for her to get to our office for testing and she denies. I advised I will call her back before I leave today with Dr. Harvie BridgeNahser's advice. She verbalized understanding and thanked me for the call.

## 2017-02-19 NOTE — Telephone Encounter (Signed)
Lets get an echo for further assessment of her LV function Start metoprolol 12.5 mg BID 30 day event monitor  Office visit in 2 months , sooner if she needs

## 2017-02-20 NOTE — Telephone Encounter (Signed)
Reviewed Dr. Harvie BridgeNahser's advice with patient who states she would like to start with the echo and event monitor.  She states she will monitor HR and BP over the next several days but in the past her BP has run low and she wants to monitor before starting a medication. I advised that I will make our scheduling department aware that she will need these things scheduled for the same day since [place] from [place]. I advised her to call back if her HR is consistently >90 bpm and she feels that her BP can tolerate starting the metoprolol. She verbalized understanding and agreement with plan and thanked me for the call.

## 2017-03-06 ENCOUNTER — Other Ambulatory Visit (HOSPITAL_COMMUNITY)

## 2017-03-06 ENCOUNTER — Encounter

## 2017-03-25 ENCOUNTER — Other Ambulatory Visit (HOSPITAL_COMMUNITY)

## 2017-03-25 ENCOUNTER — Encounter

## 2017-04-01 ENCOUNTER — Other Ambulatory Visit: Payer: Self-pay | Admitting: Cardiovascular Disease

## 2017-04-01 ENCOUNTER — Telehealth: Payer: Self-pay | Admitting: Cardiovascular Disease

## 2017-04-01 MED ORDER — POTASSIUM CHLORIDE CRYS ER 20 MEQ PO TBCR: 20.0000 meq | EXTENDED_RELEASE_TABLET | Freq: Every day | ORAL | 0 refills | Status: DC

## 2017-04-01 NOTE — Telephone Encounter (Signed)
New Message  Pt c/o medication issue:  1. Name of Medication: potassium   2. How are you currently taking this medication (dosage and times per day)? 20 MEQ  3. Are you having a reaction (difficulty breathing--STAT)? No   4. What is your medication issue? Per pt has some questions about the medication.

## 2017-04-01 NOTE — Telephone Encounter (Signed)
Spoke with patient who states she went to [facility] to get her [facility] Rx transferred from [facility] [place]. She states she was told that it was too soon to get the refill. I advised that the Rx was sent on 9/11 which was >90 days ago. Patient states she was not clear on why she was having a problem getting the medication. She states she was told that she could get a 30 day supply sent to them. I advised I would call [facility] to straighten this out. I spoke with [facility] at [facility] who states they attempted to [facility] but Rx was dated 10/1. I asked if I could send a Rx for 30 day supply and she confirmed.

## 2017-04-15 ENCOUNTER — Ambulatory Visit (INDEPENDENT_AMBULATORY_CARE_PROVIDER_SITE_OTHER)

## 2017-04-15 ENCOUNTER — Other Ambulatory Visit: Payer: Self-pay

## 2017-04-15 ENCOUNTER — Ambulatory Visit (HOSPITAL_COMMUNITY): Attending: Cardiovascular Disease

## 2017-04-15 DIAGNOSIS — Z87891 Personal history of nicotine dependence: Secondary | ICD-10-CM | POA: Diagnosis not present

## 2017-04-15 DIAGNOSIS — R002 Palpitations: Secondary | ICD-10-CM

## 2017-04-15 DIAGNOSIS — I341 Nonrheumatic mitral (valve) prolapse: Secondary | ICD-10-CM | POA: Insufficient documentation

## 2017-04-15 DIAGNOSIS — I10 Essential (primary) hypertension: Secondary | ICD-10-CM | POA: Diagnosis not present

## 2017-04-17 ENCOUNTER — Telehealth: Payer: Self-pay | Admitting: Nurse Practitioner

## 2017-04-17 NOTE — Telephone Encounter (Signed)
I do not think the headaches are from the MR .

## 2017-04-17 NOTE — Telephone Encounter (Signed)
Reviewed echo results with patient. She verbalized understanding and agreement with plan to proceed with TEE and would like to schedule the test for January 24 at 0900. I advised that I will call to schedule tomorrow and will call her back with more detailed instructions. She would like me to forward the following questions to Dr. Elease HashimotoNahser: she has noticed more headaches recently, could this be the cause? She is also having more palpitations. I advised that her palpitations could be related to the MVP and also could be related to her slight hypokalemia for which she is now taking potassium supplement. She asked about antibiotic therapy before dental procedures and I advised that AHA guidelines recommend antibiotics post valve replacement. I advised I will call her back tomorrow after scheduling her TEE. She thanked me for the call.

## 2017-04-17 NOTE — Telephone Encounter (Signed)
-----   Message from Vesta MixerPhilip J Nahser, MD sent at 04/17/2017  4:49 PM EST ----- Moderate - severe MR due to mitral valve prolapse. Lets schedule her for a TEE during my next Reader B day .

## 2017-04-18 NOTE — Telephone Encounter (Signed)
Reviewed instructions for TEE with patient who verbalized understanding and agreement. She is aware of Dr. Harvie BridgeNahser's advice that he does not think the headaches are r/t the mitral valve. I advised her to call back with questions or concerns and she thanked me for the call.

## 2017-04-18 NOTE — Telephone Encounter (Signed)
  You are scheduled for a TEE on Thursday January 24 with Dr. Elease HashimotoNahser.  Please arrive [facility] the [facility] A[facility] at [facility]: [address] [place], [place] [address] at 8:00 am. (1 hour prior to procedure unless lab work is needed)  DIET: Nothing to eat or drink after midnight except a sip of water with medications (see medication instructions below)  Medication Instructions: You may take your medications with a sip of water before your procedure  You must have a responsible person to drive you home and stay in the waiting area during your procedure. Failure to do so could result in cancellation.  Bring your insurance cards.  *Special Note: Every effort is made to have your procedure done on time. Occasionally there are emergencies that occur at the hospital that may cause delays. Please be patient if a delay does occur.

## 2017-04-19 ENCOUNTER — Telehealth: Payer: Self-pay | Admitting: Nurse Practitioner

## 2017-04-19 MED ORDER — METOPROLOL SUCCINATE ER 25 MG PO TB24: 12.5000 mg | ORAL_TABLET | Freq: Every day | ORAL | 3 refills | Status: DC

## 2017-04-19 NOTE — Telephone Encounter (Signed)
Spoke with patient and reviewed monitor findings and Dr. Harvie BridgeNahser's advice to start Toprol 12.5 mg daily. Patient states she has been monitoring her BP and it is usually <115 mmHg systolic. We discussed the benefit of taking the beta blocker daily rather than taking PRN propranolol. She is in agreement to start the Toprol and continue to monitor HR and BP. She is aware of how to do the valsalva maneuver. She states when the episode occurred today at [phone] EST she was walking up a hill on her way to work. She will continue to wear the monitor so that we can assess how frequently the SVT is occurring. I advised her to contact us with additional questions or concerns. She verbalized understanding and agreement with plan and thanked me for the call.

## 2017-04-19 NOTE — Telephone Encounter (Signed)
Left message for patient to call back regarding SVT found on event monitor

## 2017-05-08 ENCOUNTER — Other Ambulatory Visit: Payer: Self-pay | Admitting: Cardiovascular Disease

## 2017-05-08 DIAGNOSIS — I341 Nonrheumatic mitral (valve) prolapse: Secondary | ICD-10-CM

## 2017-05-09 ENCOUNTER — Encounter (HOSPITAL_COMMUNITY)
Admission: RE | Disposition: A | Discharge status: Home / Self Care | Payer: Self-pay | Source: Ambulatory Visit | Attending: Cardiovascular Disease

## 2017-05-09 ENCOUNTER — Ambulatory Visit (HOSPITAL_BASED_OUTPATIENT_CLINIC_OR_DEPARTMENT_OTHER)
Admission: RE | Admit: 2017-05-09 | Discharge: 2017-05-09 | Disposition: A | Discharge status: Home / Self Care | Source: Ambulatory Visit | Attending: Cardiovascular Disease | Admitting: Cardiovascular Disease

## 2017-05-09 ENCOUNTER — Ambulatory Visit (HOSPITAL_COMMUNITY)
Admission: RE | Admit: 2017-05-09 | Discharge: 2017-05-09 | Disposition: A | Discharge status: Home / Self Care | Source: Ambulatory Visit | Attending: Cardiovascular Disease | Admitting: Cardiovascular Disease

## 2017-05-09 ENCOUNTER — Other Ambulatory Visit: Payer: Self-pay

## 2017-05-09 DIAGNOSIS — D509 Iron deficiency anemia, unspecified: Secondary | ICD-10-CM | POA: Insufficient documentation

## 2017-05-09 DIAGNOSIS — I493 Ventricular premature depolarization: Secondary | ICD-10-CM | POA: Diagnosis not present

## 2017-05-09 DIAGNOSIS — D561 Beta thalassemia: Secondary | ICD-10-CM | POA: Diagnosis not present

## 2017-05-09 DIAGNOSIS — Z79899 Other long term (current) drug therapy: Secondary | ICD-10-CM | POA: Insufficient documentation

## 2017-05-09 DIAGNOSIS — I341 Nonrheumatic mitral (valve) prolapse: Secondary | ICD-10-CM | POA: Diagnosis not present

## 2017-05-09 DIAGNOSIS — I340 Nonrheumatic mitral (valve) insufficiency: Secondary | ICD-10-CM

## 2017-05-09 DIAGNOSIS — R42 Dizziness and giddiness: Secondary | ICD-10-CM | POA: Diagnosis not present

## 2017-05-09 DIAGNOSIS — Z888 Allergy status to other drugs, medicaments and biological substances status: Secondary | ICD-10-CM | POA: Insufficient documentation

## 2017-05-09 DIAGNOSIS — F419 Anxiety disorder, unspecified: Secondary | ICD-10-CM | POA: Diagnosis not present

## 2017-05-09 DIAGNOSIS — I10 Essential (primary) hypertension: Secondary | ICD-10-CM | POA: Diagnosis not present

## 2017-05-09 DIAGNOSIS — Z91018 Allergy to other foods: Secondary | ICD-10-CM | POA: Diagnosis not present

## 2017-05-09 DIAGNOSIS — E876 Hypokalemia: Secondary | ICD-10-CM | POA: Diagnosis not present

## 2017-05-09 DIAGNOSIS — Z885 Allergy status to narcotic agent status: Secondary | ICD-10-CM | POA: Insufficient documentation

## 2017-05-09 DIAGNOSIS — M1990 Unspecified osteoarthritis, unspecified site: Secondary | ICD-10-CM | POA: Insufficient documentation

## 2017-05-09 DIAGNOSIS — Z87891 Personal history of nicotine dependence: Secondary | ICD-10-CM | POA: Diagnosis not present

## 2017-05-09 HISTORY — PX: TEE WITHOUT CARDIOVERSION: SHX5443

## 2017-05-09 SURGERY — ECHOCARDIOGRAM, TRANSESOPHAGEAL
Anesthesia: Moderate Sedation

## 2017-05-09 MED ORDER — FENTANYL CITRATE (PF) 100 MCG/2ML IJ SOLN
INTRAMUSCULAR | Status: AC
  Filled 2017-05-09: qty 2

## 2017-05-09 MED ORDER — MIDAZOLAM HCL 10 MG/2ML IJ SOLN
INTRAMUSCULAR | Status: DC | PRN
  Administered 2017-05-09: 2 mg via INTRAVENOUS
  Administered 2017-05-09: 1 mg via INTRAVENOUS
  Administered 2017-05-09: 2 mg via INTRAVENOUS

## 2017-05-09 MED ORDER — FENTANYL CITRATE (PF) 100 MCG/2ML IJ SOLN
INTRAMUSCULAR | Status: DC | PRN
  Administered 2017-05-09: 25 ug via INTRAVENOUS
  Administered 2017-05-09: 50 ug via INTRAVENOUS

## 2017-05-09 MED ORDER — SODIUM CHLORIDE 0.9 % IV SOLN
INTRAVENOUS | Status: DC
  Administered 2017-05-09: 08:00:00 via INTRAVENOUS

## 2017-05-09 MED ORDER — BUTAMBEN-TETRACAINE-BENZOCAINE 2-2-14 % EX AERO
INHALATION_SPRAY | CUTANEOUS | Status: DC | PRN
  Administered 2017-05-09: 2 via TOPICAL

## 2017-05-09 MED ORDER — MIDAZOLAM HCL 5 MG/ML IJ SOLN
INTRAMUSCULAR | Status: AC
  Filled 2017-05-09: qty 2

## 2017-05-09 NOTE — Discharge Instructions (Signed)
Moderate Conscious Sedation, Adult °Sedation is the use of medicines to promote relaxation and relieve discomfort and anxiety. Moderate conscious sedation is a type of sedation. Under moderate conscious sedation, you are less alert than normal, but you are still able to respond to instructions, touch, or both. °Moderate conscious sedation is used during short medical and dental procedures. It is milder than deep sedation, which is a type of sedation under which you cannot be easily woken up. It is also milder than general anesthesia, which is the use of medicines to make you unconscious. Moderate conscious sedation allows you to return to your regular activities sooner. °Tell a health care provider about: °· Any allergies you have. °· All medicines you are taking, including vitamins, herbs, eye drops, creams, and over-the-counter medicines. °· Use of steroids (by mouth or creams). °· Any problems you or family members have had with sedatives and anesthetic medicines. °· Any blood disorders you have. °· Any surgeries you have had. °· Any medical conditions you have, such as sleep apnea. °· Whether you are pregnant or may be pregnant. °· Any use of cigarettes, alcohol, marijuana, or street drugs. °What are the risks? °Generally, this is a safe procedure. However, problems may occur, including: °· Getting too much medicine (oversedation). °· Nausea. °· Allergic reaction to medicines. °· Trouble breathing. If this happens, a breathing tube may be used to help with breathing. It will be removed when you are awake and breathing on your own. °· Heart trouble. °· Lung trouble. ° °What happens before the procedure? °Staying hydrated °Follow instructions from your health care provider about hydration, which may include: °· Up to 2 hours before the procedure - you may continue to drink clear liquids, such as water, clear fruit juice, black coffee, and plain tea. ° °Eating and drinking restrictions °Follow instructions from  your health care provider about eating and drinking, which may include: °· 8 hours before the procedure - stop eating heavy meals or foods such as meat, fried foods, or fatty foods. °· 6 hours before the procedure - stop eating light meals or foods, such as toast or cereal. °· 6 hours before the procedure - stop drinking milk or drinks that contain milk. °· 2 hours before the procedure - stop drinking clear liquids. ° °Medicine ° °Ask your health care provider about: °· Changing or stopping your regular medicines. This is especially important if you are taking diabetes medicines or blood thinners. °· Taking medicines such as aspirin and ibuprofen. These medicines can thin your blood. Do not take these medicines before your procedure if your health care provider instructs you not to. ° °Tests and exams °· You will have a physical exam. °· You may have blood tests done to show: °? How well your kidneys and liver are working. °? How well your blood can clot. °General instructions °· Plan to have someone take you home from the hospital or clinic. °· If you will be going home right after the procedure, plan to have someone with you for 24 hours. °What happens during the procedure? °· An IV tube will be inserted into one of your veins. °· Medicine to help you relax (sedative) will be given through the IV tube. °· The medical or dental procedure will be performed. °What happens after the procedure? °· Your blood pressure, heart rate, breathing rate, and blood oxygen level will be monitored often until the medicines you were given have worn off. °· Do not drive for 24 hours. °  This information is not intended to replace advice given to you by your health care provider. Make sure you discuss any questions you have with your health care provider. °Document Released: 12/26/2000 Document Revised: 09/06/2015 Document Reviewed: 07/23/2015 °Elsevier Interactive Patient Education © 2018 Elsevier Inc. ° °

## 2017-05-09 NOTE — CV Procedure (Signed)
    Transesophageal Echocardiogram Note  Lolly MustacheJennifer A Flamm [id number] 12/16/1980  Procedure: Transesophageal Echocardiogram Indications: Mitral valve prolapse, mityral regurgitation  Procedure Details Consent: Obtained Time Out: Verified patient identification, verified procedure, site/side was marked, verified correct patient position, special equipment/implants available, Radiology Safety Procedures followed,  medications/allergies/relevent history reviewed, required imaging and test results available.  Performed  Medications:  During this procedure the patient is administered a total of Versed 5  mg and Fentanyl 75  mcg  to achieve and maintain moderate conscious sedation.  The patient's heart rate, blood pressure, and oxygen saturation are monitored continuously during the procedure. The period of conscious sedation is 30  minutes, of which I was present face-to-face 100% of this time.  Left Ventrical:  Normal LV function   Mitral Valve: moderate prolapse of 1 scallop ( likely P2) with eccentric mitral regurgitation The MR is likely moderate.   No reversal of flow in the pulmonary veins  Aortic Valve: normal   Tricuspid Valve: trace TR   Pulmonic Valve: trace PI  Left Atrium/ Left atrial appendage: normal,  No LAA thrombus   Atrial septum: no obvious abnormalities.   No bubble study performed.   Aorta: normal    Complications: No apparent complications Patient did tolerate procedure well.   Vesta MixerPhilip J. Nahser, Montez HagemanJr., MD, [facility] 05/09/2017, 8:57 AM

## 2017-05-09 NOTE — H&P (Signed)
Andrea [name] Date of Birth              1981/03/26 [facility]                                                     [address]    [address]                            [place], [place]  [address]                                              [phone]              Fax  [phone]                    Problems: 1. Mitral Valve Prolapse 2. Dizziness 3. Thalassemia- iron deficient anemia.   History of Present Illness:  Trany is a 37 y.o. with MVP.  She presented 2 months ago  with symptoms of dizziness.  She has these episodes at various times. She had one severe episode while driving. It was described as a very slow motion sensation that the room was spinning. She also these episodes while standing and sitting.   She has the sensation that she spinning.  These episodes started in November or December but not particularly worsened but because of their persistence she presents today for further evaluation. They're not necessarily related to turning of her head. They're not orthostatic in nature   These episodes of dizziness have resolved. She is overall doing quite well.  Sept. 10, 2018:  Andrea [name] is seen today after a 6 year absence. Had moved to [facility] now back at [place] Had a recent episode of palpitations - felt like she was being kicked in the chest Lasted off and on for 3 weeks.  Went to the ER Potassium was found to be 3.2  Was given potassium replacement but did not get an ongoing script.  Has been exercising some   Jan. 24, 2019  Echo shows mod - severe MR Will get a TEE for further eval           Current Outpatient Prescriptions on File Prior to Visit  Medication Sig Dispense Refill  . ALPRAZolam (XANAX) 0.5 MG tablet Take 0.5 mg by mouth at bedtime as needed for sleep.    Marland Kitchen etonogestrel-ethinyl estradiol (NUVARING) 0.12-0.015 MG/24HR vaginal ring Place 1 each vaginally every 28 (twenty-eight) days.     . methocarbamol (ROBAXIN)  500 MG tablet Take 1 tablet (500 mg total) by mouth 3 (three) times daily. 90 tablet 0   No current facility-administered medications on file prior to visit.          Allergies  Allergen Reactions  . Other Anaphylaxis    Nuts- Anaphylaxis  Peel able fruit - Anaphylaxis Nuts- Anaphylaxis  Peel able fruit - Anaphylaxis  . Stadol [Butorphanol Tartrate]     hallucinations  . Terbutaline Other (See Comments)    Hallucinations   . Tributyl Phosphate     tributaline- hallucinations        Past Medical History:  Diagnosis Date  .  Anemia   . Anemia, iron deficiency 04/05/2011  . Anxiety   . Arthritis   . Beta thalassemia major (HCC)    dx 10 years ago, Dr. Darnelle CatalanMagrinat  . Heart murmur   . Hypertension   . Kidney stones   . Mitral regurgitation due to cusp prolapse   . MVP (mitral valve prolapse)   . Thalassemia 04/05/2011         Past Surgical History:  Procedure Laterality Date  . KNEE SURGERY     left x 2  . OVARIAN CYST REMOVAL     rupture  . TONSILLECTOMY    . TUBAL LIGATION          History  Smoking Status  . Former Smoker  . Types: Cigarettes  . Quit date: 04/16/2000  Smokeless Tobacco  . Never Used        History  Alcohol Use  . 2.4 oz/week  . 4 Glasses of wine per week    Comment: 1-2 per week         Family History  Problem Relation Age of Onset  . Diabetes Father   . Hypertension Father   . Breast cancer Mother     Reviw of Systems:  Reviewed in the HPI.  All other systems are negative.  Physical Exam: Blood pressure 120/81, pulse 82, temperature 98 F (36.7 C), temperature source Oral, resp. rate 14, height 5\' 5"  (1.651 m), weight 171 lb (77.6 kg), last menstrual period 03/30/2017, SpO2 99 %.  GEN:  Well nourished, well developed in no acute distress HEENT: Normal NECK: No JVD; No carotid bruits LYMPHATICS: No lymphadenopathy CARDIAC: RR, 2/6 systolic murmur radiating to the axilla   RESPIRATORY:  Clear to auscultation without rales, wheezing or rhonchi  ABDOMEN: Soft, non-tender, non-distended MUSCULOSKELETAL:  No edema; No deformity  SKIN: Warm and dry NEUROLOGIC:  Alert and oriented x 3  Assessment / Plan:   1.  Mitral valve prolapse - with moderate MR Clinically stable Echo shows moderate - severe MR. Will schedule for TEE Has discussed risks, benefits, options. She understands and agrees to proceed.    2. PVCs:   In the setting of hypokalemia:   Will check BMP and TSH today . She may need a potassium suppliment.

## 2017-05-09 NOTE — Progress Notes (Signed)
  Echocardiogram Echocardiogram Transesophageal has been performed.  Andrea [name] T Andrea [name] 05/09/2017, 9:04 AM

## 2017-05-12 ENCOUNTER — Encounter (HOSPITAL_COMMUNITY): Payer: Self-pay | Admitting: Cardiovascular Disease

## 2017-06-26 ENCOUNTER — Telehealth: Payer: Self-pay | Admitting: Cardiovascular Disease

## 2017-06-26 DIAGNOSIS — I471 Supraventricular tachycardia: Secondary | ICD-10-CM

## 2017-06-26 DIAGNOSIS — R002 Palpitations: Secondary | ICD-10-CM

## 2017-06-26 DIAGNOSIS — I341 Nonrheumatic mitral (valve) prolapse: Secondary | ICD-10-CM

## 2017-06-26 MED ORDER — METOPROLOL SUCCINATE ER 25 MG PO TB24: 25.0000 mg | ORAL_TABLET | Freq: Every day | ORAL | 3 refills | Status: DC

## 2017-06-26 NOTE — Telephone Encounter (Signed)
Spoke with patient who states when she first started Toprol it helped for the first 2 weeks and then gradually she has noticed that she feels the PVCs getting worse. She states yesterday was a "terrible day" and she felt like she was going to pass out while walking around at the mall. She states she believes her symptoms are still related to SVT and/or PVCs and she has not had any additional life stressors. She c/o a "kick" in her chest and a funny feeling in her throat followed by nausea. She states she has wanted to take a full 25 mg tablet of Toprol but was concerned about it dropping her BP. She states she also wonders if she should continue the potassium. I asked if she has had recent lab work done anywhere (patient lives and works in [place]) and she denies. She states she can go to the lab where she works if we will put the orders in epic. She also requests an order for CBC for hx thalassemia. She states she is wondering if she needs treatment for thalassemia and if this is contributing to her symptoms. I advised her to go ahead and increase her Toprol to 25 mg daily to see if she tolerates. 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 Elease HashimotoNahser. She thanked me for my help.

## 2017-06-26 NOTE — Telephone Encounter (Signed)
New message   Pt c/o medication issue:  1. Name of Medication: metoprolol succinate (TOPROL XL) 25 MG 24 hr tablet  2. How are you currently taking this medication (dosage and times per day)? Take 0.5 tablets (12.5 mg total) by mouth daily.  3. Are you having a reaction (difficulty breathing--STAT)? No   4. What is your medication issue?  Medication is not helping

## 2017-06-26 NOTE — Telephone Encounter (Signed)
Her symptoms are c/w more PVCs I think the additional Toprol will help

## 2017-07-16 ENCOUNTER — Ambulatory Visit: Admitting: Cardiovascular Disease

## 2017-07-16 DIAGNOSIS — R0989 Other specified symptoms and signs involving the circulatory and respiratory systems: Secondary | ICD-10-CM

## 2017-12-27 ENCOUNTER — Telehealth: Payer: Self-pay | Admitting: Cardiovascular Disease

## 2017-12-27 NOTE — Telephone Encounter (Signed)
Patient complaining of having weight gain of  15 lbs since taking metoprolol and having PVCs that have become worse. Patient stated she feels the PVCs in her throat, feels like she has difficulty breathing when she feels these PVCs in her throat and it feels like she going to pass out at times. Patient would like to hear back from Dr. Elease HashimotoNahser or his nurse Marcelino DusterMichelle. Will forward to Dr. Elease HashimotoNahser.

## 2017-12-27 NOTE — Telephone Encounter (Signed)
New Message         Pt c/o medication issue:  1. Name of Medication: Metoprolol   2. How are you currently taking this medication (dosage and times per day)? Once a day 25 mg  3. Are you having a reaction (difficulty breathing--STAT)? No   4. What is your medication issue? Patient states medication is not helping and she has gained a lot of weight from taking it and no longer wants to take it. Patient wants to know what her options are? Pls call and advise.

## 2017-12-27 NOTE — Telephone Encounter (Signed)
The PVCs have worsened while on metoprolol DC metoprolol Lets check BMP - her previous worsening of her PVCs were related to hypokalemia

## 2017-12-27 NOTE — Telephone Encounter (Signed)
Spoke with the Andrea [name] and informed her of recommendations per Dr Elease HashimotoNahser, for her to discontinue taking her metoprolol, and have a BMET checked, for her previous worsening of her PVCs were related to hypokalemia.  Andrea [name] states she lives in [place], and prefers that we send her a hard written prescription for BMET to be checked at [phone] attention to [employer].  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  Wrote a Equities traderscript for BMET check, and endorsed on this to fax results attention to Dr Elease HashimotoNahser and Marcelino DusterMichelle RN at [phone].  Andrea [name] verbalized understanding and agrees with this plan.  Andrea [name] did also want to mention to Dr Elease HashimotoNahser that she took it upon herself to increase her K-DUR to 40 mEq po daily, about a month ago. Advised the Andrea [name] that she should take this med as advised by Dr Elease HashimotoNahser, and we will recheck her BMET as planned.   Andrea [name] also wanted to ask that Marcelino DusterMichelle RN call her back upon return to the office, to see if she could add her in to see Dr Elease HashimotoNahser before November (that was offered for next available appt by scheduler).  Andrea [name] states she is overdue to see him.   Informed the Andrea [name] that Marcelino DusterMichelle is out of the office at this time, but I will route this message to her for further review and follow-up with the Andrea [name].  Andrea [name] verbalized understanding and agrees with this plan.

## 2017-12-31 NOTE — Telephone Encounter (Signed)
Left message for patient to call back regarding scheduling an appointment.

## 2018-01-01 NOTE — Telephone Encounter (Signed)
Spoke with patient who requests an appointment for Tuesday 9/24. She will be traveling from out of town so I scheduled her for 7:40 on 9/24. She thanked me for my help.

## 2018-01-07 ENCOUNTER — Ambulatory Visit (INDEPENDENT_AMBULATORY_CARE_PROVIDER_SITE_OTHER)

## 2018-01-07 ENCOUNTER — Encounter (INDEPENDENT_AMBULATORY_CARE_PROVIDER_SITE_OTHER): Payer: Self-pay

## 2018-01-07 ENCOUNTER — Ambulatory Visit (INDEPENDENT_AMBULATORY_CARE_PROVIDER_SITE_OTHER): Admitting: Cardiovascular Disease

## 2018-01-07 ENCOUNTER — Encounter: Payer: Self-pay | Admitting: Cardiovascular Disease

## 2018-01-07 VITALS — BP 104/82 | HR 87 | Ht 65.0 in | Wt 188.8 lb

## 2018-01-07 DIAGNOSIS — R002 Palpitations: Secondary | ICD-10-CM

## 2018-01-07 DIAGNOSIS — I341 Nonrheumatic mitral (valve) prolapse: Secondary | ICD-10-CM

## 2018-01-07 MED ORDER — PROPRANOLOL HCL 10 MG PO TABS: 10.0000 mg | ORAL_TABLET | Freq: Four times a day (QID) | ORAL | 3 refills | Status: DC | PRN

## 2018-01-07 MED ORDER — PROPRANOLOL HCL 10 MG PO TABS: 10.0000 mg | ORAL_TABLET | Freq: Four times a day (QID) | ORAL | 3 refills | Status: DC

## 2018-01-07 NOTE — Patient Instructions (Addendum)
Medication Instructions:  1) START Propranolol 10mg  four times daily as needed  Labwork: BMET, Magnesium, CBC and TSH today  Testing/Procedures: Your physician has recommended that you wear an event monitor today if possible. Event monitors are medical devices that record the heart's electrical activity. Doctors most often us these monitors to diagnose arrhythmias. Arrhythmias are problems with the speed or rhythm of the heartbeat. The monitor is a small, portable device. You can wear one while you do your normal daily activities. This is usually used to diagnose what is causing palpitations/syncope (passing out).  Your physician has requested that you have an echocardiogram when you return in a month. Echocardiography is a painless test that uses sound waves to create images of your heart. It provides your doctor with information about the size and shape of your heart and how well your heart's chambers and valves are working. This procedure takes approximately one hour. There are no restrictions for this procedure.   Follow-Up: Your physician recommends that you schedule a follow-up appointment in: 1 month with Dr. Elease HashimotoNahser (Per Dr. Elease HashimotoNahser 11/4 ok).   Any Other Special Instructions Will Be Listed Below (If Applicable).     If you need a refill on your cardiac medications before your next appointment, please call your pharmacy.

## 2018-01-07 NOTE — Progress Notes (Signed)
Andrea [name] Date of Birth  09/11/1980 [facility]     [place] Office  [address]    [address]   [address] [place], [place]  [address]    [place], [place]  [address] [phone]  Fax  [phone]  [phone]  Fax [phone]  Problems: 1. Mitral Valve Prolapse 2. Dizziness 3. Thalassemia- iron deficient anemia.      Andrea [name] is a 37 y.o. with MVP.  She presented 2 months ago  with symptoms of dizziness.  She has these episodes at various times. She had one severe episode while driving. It was described as a very slow motion sensation that the room was spinning. She also these episodes while standing and sitting.   She has the sensation that she spinning.  These episodes started in November or December but not particularly worsened but because of their persistence she presents today for further evaluation. They're not necessarily related to turning of her head. They're not orthostatic in nature   These episodes of dizziness have resolved. She is overall doing quite well.  Sept. 10, 2018:  Andrea [name] is seen today after a 6 year absence. Had moved to [facility], now back at [place]. [place] Had a recent episode of palpitations - felt like she was being kicked in the chest Lasted off and on for 3 weeks.  Went to the ER Potassium was found to be 3.2  Was given potassium replacement but did not get an ongoing script.  Has been exercising some   Sept. 24, 2019: Andrea [name] is seen today as a work in visit Has not tolerated the metoprolol  Had a TEE in Jan.  24, 2019: Was found to have moderate mitral valve prolapse with moderate MR There was no reversal of flow in the pulmonary veins.  About 4 weeks ago, she started feeling a quivering in her heart.  Feels that her HR is irregular. Associated with CP and anxiety .   Did not tolerate the metoprolol - palpitations seemed to have worsened and she gained weight   Will last for hours ,  HR on Apple watch will go  to 137. Is in NP school.   Goes [employer] . Thinks that some of this could be due to anxiety  ( her step mother died recently )  Lives in [place],   Is not exercise ,  She is frightened to exercise . Has not had blood work recently    Current Outpatient Medications on File Prior to Visit  Medication Sig Dispense Refill  . etonogestrel-ethinyl estradiol (NUVARING) 0.12-0.015 MG/24HR vaginal ring Place 1 each vaginally every 28 (twenty-eight) days.     . potassium chloride SA (K-DUR,KLOR-CON) 20 MEQ tablet TAKE 1 TABLET(20 MEQ) BY MOUTH DAILY 90 tablet 2   No current facility-administered medications on file prior to visit.     Allergies  Allergen Reactions  . Other Anaphylaxis    Nuts- Anaphylaxis  Peel able fruit - Anaphylaxis Nuts- Anaphylaxis  Peel able fruit - Anaphylaxis  . Stadol [Butorphanol Tartrate]     hallucinations  . Terbutaline Other (See Comments)    Hallucinations   . Tributyl Phosphate     tributaline- hallucinations    Past Medical History:  Diagnosis Date  . Anemia   . Anemia, iron deficiency 04/05/2011  . Anxiety   . Arthritis   . Beta thalassemia major (HCC)    dx 10 years ago, Dr. Darnelle Catalan  . Heart murmur   .  Hypertension   . Kidney stones   . Mitral regurgitation due to cusp prolapse   . MVP (mitral valve prolapse)   . Thalassemia 04/05/2011    Past Surgical History:  Procedure Laterality Date  . KNEE SURGERY     left x 2  . OVARIAN CYST REMOVAL     rupture  . TEE WITHOUT CARDIOVERSION N/A 05/09/2017   Procedure: TRANSESOPHAGEAL ECHOCARDIOGRAM (TEE);  Surgeon: Elease HashimotoNahser, Deloris PingPhilip J, MD;  [facility];  Service: Cardiovascular;  Laterality: N/A;  . TONSILLECTOMY    . TUBAL LIGATION      Social History   Tobacco Use  Smoking Status Former Smoker  . Types: Cigarettes  . Last attempt to quit: 04/16/2000  . Years since quitting: 17.7  Smokeless Tobacco Never Used    Social History   Substance and Sexual Activity   Alcohol Use Yes  . Alcohol/week: 4.0 standard drinks  . Types: 4 Glasses of wine per week   Comment: 1-2 per week    Family History  Problem Relation Age of Onset  . Diabetes Father   . Hypertension Father   . Breast cancer Mother     Reviw of Systems:  Reviewed in the HPI.  All other systems are negative.  Physical Exam: There were no vitals taken for this visit.  GEN:  Well nourished, well developed in no acute distress HEENT: Normal NECK: No JVD; No carotid bruits LYMPHATICS: No lymphadenopathy CARDIAC: RRR,   2/-3/6 systolic murmur radiating to the axilla  RESPIRATORY:  Clear to auscultation without rales, wheezing or rhonchi  ABDOMEN: Soft, non-tender, non-distended MUSCULOSKELETAL:  No edema; No deformity  SKIN: Warm and dry NEUROLOGIC:  Alert and oriented x 3     ECG: January 07, 2018: Normal sinus rhythm at 87.  She has no ST or T wave changes.  Assessment / Plan:   1.  Mitral valve prolapse -    she has classic findings of mitral valve prolapse on exam.  It is possible that her mitral valve regurgitation is worsened slightly we will schedule her for an echocardiogram.  [place] [facility] so it is unlikely that we will be able to do the echocardiogram today.  We will plan on getting the echocardiogram to see her again in 1 month.  2. PVCs:   -She is having lots of palpitations.  These palpitations feel different than her previous palpitations.  She did not tolerate the metoprolol because of fatigue and weight gain.  We will try propranolol 10 mg 4 times a day as needed.  We tried to arrange for a 30-day event monitor.    We discussed using the Kardia monitir by [employer] She will be able to save the tracings and email them to us via MyChart portal.   I will see her in about 1 month.  She will call for questions.  She will keep a diary of when she has palpitations.    Kristeen MissPhilip Waylynn Benefiel, MD  01/07/2018 10:17 AM    [facility]  HeartCare [address],  [address] [place], [place]  [address] Pager [phone] Phone: [phone](336) [phone]; Fax: [phone](336) [phone]

## 2018-01-08 LAB — BASIC METABOLIC PANEL
BUN/Creatinine Ratio: 11 (ref 9–23)
BUN: 10 mg/dL (ref 6–20)
CALCIUM: 8.7 mg/dL (ref 8.7–10.2)
CHLORIDE: 104 mmol/L (ref 96–106)
CO2: 20 mmol/L (ref 20–29)
Creatinine, Ser: 0.87 mg/dL (ref 0.57–1.00)
GFR calc Af Amer: 97 mL/min/{1.73_m2} (ref >59)
GFR calc non Af Amer: 84 mL/min/{1.73_m2} (ref >59)
GLUCOSE: 110 mg/dL — AB (ref 65–99)
Potassium: 3.9 mmol/L (ref 3.5–5.2)
Sodium: 139 mmol/L (ref 134–144)

## 2018-01-08 LAB — TSH: TSH: 2.02 u[IU]/mL (ref 0.450–4.500)

## 2018-01-08 LAB — CBC
Hematocrit: 31.6 % — ABNORMAL LOW (ref 34.0–46.6)
Hemoglobin: 9.3 g/dL — ABNORMAL LOW (ref 11.1–15.9)
MCH: 20.3 pg — ABNORMAL LOW (ref 26.6–33.0)
MCHC: 29.4 g/dL — AB (ref 31.5–35.7)
MCV: 69 fL — ABNORMAL LOW (ref 79–97)
Platelets: 268 10*3/uL (ref 150–450)
RBC: 4.58 x10E6/uL (ref 3.77–5.28)
RDW: 18.6 % — AB (ref 12.3–15.4)
WBC: 7.8 10*3/uL (ref 3.4–10.8)

## 2018-01-08 LAB — MAGNESIUM: MAGNESIUM: 1.9 mg/dL (ref 1.6–2.3)

## 2018-01-09 ENCOUNTER — Telehealth: Payer: Self-pay | Admitting: Cardiovascular Disease

## 2018-01-09 NOTE — Telephone Encounter (Signed)
Follow Up ° ° ° ° °Returning your call from yesterday, concerning her lab results. °

## 2018-01-09 NOTE — Telephone Encounter (Signed)
Reviewed lab results with patient who verbalized understanding and thanked me for the call 

## 2018-01-14 ENCOUNTER — Telehealth: Payer: Self-pay | Admitting: Cardiovascular Disease

## 2018-01-14 NOTE — Telephone Encounter (Signed)
Informed pt that we had not received any reports so far.  Explained that our office will reach out to the patient if anything comes through. Pt appreciative of the call and information.

## 2018-01-14 NOTE — Telephone Encounter (Signed)
New message:   Patient calling concerning her monitor. She wanted to know have there been any reading on her monitor.

## 2018-02-04 ENCOUNTER — Encounter: Payer: Self-pay | Admitting: Cardiovascular Disease

## 2018-02-07 ENCOUNTER — Other Ambulatory Visit (HOSPITAL_COMMUNITY)

## 2018-02-07 ENCOUNTER — Ambulatory Visit: Admitting: Cardiovascular Disease

## 2018-02-24 ENCOUNTER — Other Ambulatory Visit: Payer: Self-pay

## 2018-02-24 ENCOUNTER — Encounter: Payer: Self-pay | Admitting: Cardiovascular Disease

## 2018-02-24 ENCOUNTER — Ambulatory Visit (HOSPITAL_COMMUNITY): Attending: Cardiovascular Disease

## 2018-02-24 ENCOUNTER — Ambulatory Visit (INDEPENDENT_AMBULATORY_CARE_PROVIDER_SITE_OTHER): Admitting: Cardiovascular Disease

## 2018-02-24 VITALS — BP 106/70 | HR 93 | Ht 65.0 in | Wt 187.0 lb

## 2018-02-24 DIAGNOSIS — I493 Ventricular premature depolarization: Secondary | ICD-10-CM

## 2018-02-24 DIAGNOSIS — R739 Hyperglycemia, unspecified: Secondary | ICD-10-CM

## 2018-02-24 DIAGNOSIS — I341 Nonrheumatic mitral (valve) prolapse: Secondary | ICD-10-CM

## 2018-02-24 DIAGNOSIS — R002 Palpitations: Secondary | ICD-10-CM | POA: Diagnosis not present

## 2018-02-24 NOTE — Progress Notes (Signed)
Andrea [name] Date of Birth  09-17-1980 [id number]          Problems: 1. Mitral Valve Prolapse 2. Dizziness 3. Beta Thalassemia- iron deficient anemia.   Andrea [name] is a 37 y.o. with MVP.  She presented 2 months ago  with symptoms of dizziness.  She has these episodes at various times. She had one severe episode while driving. It was described as a very slow motion sensation that the room was spinning. She also these episodes while standing and sitting.   She has the sensation that she spinning.  These episodes started in November or December but not particularly worsened but because of their persistence she presents today for further evaluation. They're not necessarily related to turning of her head. They're not orthostatic in nature   These episodes of dizziness have resolved. She is overall doing quite well.  Sept. 10, 2018:  Andrea [name] is seen today after a 6 year absence. Had moved to [facility], now back at [place]. [place] Had a recent episode of palpitations - felt like she was being kicked in the chest Lasted off and on for 3 weeks.  Went to the ER Potassium was found to be 3.2  Was given potassium replacement but did not get an ongoing script.  Has been exercising some   Sept. 24, 2019: Andrea [name] is seen today as a work in visit Has not tolerated the metoprolol  Had a TEE in Jan.  24, 2019: Was found to have moderate mitral valve prolapse with moderate MR There was no reversal of flow in the pulmonary veins.  About 4 weeks ago, she started feeling a quivering in her heart.  Feels that her HR is irregular. Associated with CP and anxiety .   Did not tolerate the metoprolol - palpitations seemed to have worsened and she gained weight   Will last for hours ,  HR on Apple watch will go to 137. Is in NP school.   Goes [employer] . Thinks that some of this could be due to anxiety  ( her step mother died recently )  Lives in [place],   Is not exercise ,  She is  frightened to exercise . Has not had blood work recently   February 24, 2018: Andrea [name] seen today for follow-up of her palpitations and mitral valve prolapse. An echocardiogram today shows normal left ventricular systolic function with an ejection fraction of 60 to 65%.  Diastolic parameters were normal.  She has severe prolapse of a small segment of the posterior leaflet.  She has mitral regurgitation which appears to be mild.  Her event monitor from September reveals normal sinus rhythm with episodes of sinus tachycardia.  She has frequent premature ventricular contractions with occasional episodes of bigeminy.  Just takes propranolol as needed.  Is walking  2 miles a day several days a week  Walks lots at work  Avoids salt Has not had any severe episodes os SVT .    Glucose was slightly elevated at last visit Will recheck and check HbA1C    Current Outpatient Medications on File Prior to Visit  Medication Sig Dispense Refill  . etonogestrel-ethinyl estradiol (NUVARING) 0.12-0.015 MG/24HR vaginal ring Place 1 each vaginally every 28 (twenty-eight) days.     . potassium chloride SA (K-DUR,KLOR-CON) 20 MEQ tablet Take 40 mEq by mouth daily.    . propranolol (INDERAL) 10 MG tablet Take 1 tablet (10 mg total) by mouth 4 (four) times daily as needed. 360  tablet 3   No current facility-administered medications on file prior to visit.     Allergies  Allergen Reactions  . Other Anaphylaxis    Nuts- Anaphylaxis  Peel able fruit - Anaphylaxis Nuts- Anaphylaxis  Peel able fruit - Anaphylaxis  . Stadol [Butorphanol Tartrate]     hallucinations  . Terbutaline Other (See Comments)    Hallucinations   . Tributyl Phosphate     tributaline- hallucinations    Past Medical History:  Diagnosis Date  . Anemia   . Anemia, iron deficiency 04/05/2011  . Anxiety   . Arthritis   . Beta thalassemia major (HCC)    dx 10 years ago, Dr. Darnelle Catalan  . Heart murmur   . Hypertension   . Kidney  stones   . Mitral regurgitation due to cusp prolapse   . MVP (mitral valve prolapse)   . Thalassemia 04/05/2011    Past Surgical History:  Procedure Laterality Date  . KNEE SURGERY     left x 2  . OVARIAN CYST REMOVAL     rupture  . TEE WITHOUT CARDIOVERSION N/A 05/09/2017   Procedure: TRANSESOPHAGEAL ECHOCARDIOGRAM (TEE);  Surgeon: Elease Hashimoto Deloris Ping, MD;  Location: [facility] ENDOSCOPY;  Service: Cardiovascular;  Laterality: N/A;  . TONSILLECTOMY    . TUBAL LIGATION      Social History   Tobacco Use  Smoking Status Former Smoker  . Types: Cigarettes  . Last attempt to quit: 04/16/2000  . Years since quitting: 17.8  Smokeless Tobacco Never Used    Social History   Substance and Sexual Activity  Alcohol Use Yes  . Alcohol/week: 4.0 standard drinks  . Types: 4 Glasses of wine per week   Comment: 1-2 per week    Family History  Problem Relation Age of Onset  . Diabetes Father   . Hypertension Father   . Breast cancer Mother     Reviw of Systems:  Reviewed in the HPI.  All other systems are negative.   Physical Exam: Blood pressure 106/70, pulse 93, height 5\' 5"  (1.651 m), weight 187 lb (84.8 kg), SpO2 99 %.  GEN:  Well nourished, well developed in no acute distress HEENT: Normal NECK: No JVD; No carotid bruits LYMPHATICS: No lymphadenopathy CARDIAC: RRR 2-3/6 late systolic murmur ,  Radiating to the axilla  RESPIRATORY:  Clear to auscultation without rales, wheezing or rhonchi  ABDOMEN: Soft, non-tender, non-distended MUSCULOSKELETAL:  No edema; No deformity  SKIN: Warm and dry NEUROLOGIC:  Alert and oriented x 3   ECG:    Assessment / Plan:   1.  Mitral valve prolapse -    Stable  Echo today looks stable .  She has severe prolapse of one scallop of the posterior leaflet.  She has mild mitral regurgitation.  This is stable from previous echoes.  2. PVCs:   -    Stable.  She was very symptomatic yesterday.  She has normal left ventricular systolic function.  I  encouraged her to drink V8 juice on occasion.  I also encouraged her to try taking propranolol if she is having a particularly severe day. She thinks that her PVCs are worse when she is anemic. Will see her in 6 months    3.  Hyperglycemia: Her glucose was 110 at her last visit.  She has a family history of diabetes.  We will check a repeat basic metabolic profile and also hemoglobin A1c.  I have encouraged her to work on a diet, exercise, weight loss program.  4.  Beta thalassemia :   She has a hx of Beta thal. Tries to eat iron containing foods  Does not tolerate iron capsules.       Andrea Miss, MD  02/24/2018 8:47 AM    [facility] [address] [place],  [address] [place], [place]  [address] Pager [phone] Phone: [phone]; Fax: [phone]

## 2018-02-24 NOTE — Patient Instructions (Signed)
Medication Instructions:  Your physician recommends that you continue on your current medications as directed. Please refer to the Current Medication list given to you today.  If you need a refill on your cardiac medications before your next appointment, please call your pharmacy.   Lab work: TODAY - Hgb A1C, BMET  If you have labs (blood work) drawn today and your tests are completely normal, you will receive your results only by: Marland Kitchen MyChart Message (if you have MyChart) OR . A paper copy in the mail If you have any lab test that is abnormal or we need to change your treatment, we will call you to review the results.   Testing/Procedures: None Ordered   Follow-Up: At [facility], you and your health needs are our priority.  As part of our continuing mission to provide you with exceptional heart care, we have created designated Provider Care Teams.  These Care Teams include your primary Cardiologist (physician) and Advanced Practice Providers (APPs -  Physician Assistants and Nurse Practitioners) who all work together to provide you with the care you need, when you need it. You will need a follow up appointment in:  6 months.  Please call our office 2 months in advance to schedule this appointment.  You may see Kristeen Miss, MD or one of the following Advanced Practice Providers on your designated Care Team: Tereso Newcomer, PA-C [place], [place] . Berton Bon, NP

## 2018-02-25 LAB — BASIC METABOLIC PANEL
BUN/Creatinine Ratio: 9 (ref 9–23)
BUN: 8 mg/dL (ref 6–20)
CALCIUM: 9 mg/dL (ref 8.7–10.2)
CHLORIDE: 104 mmol/L (ref 96–106)
CO2: 20 mmol/L (ref 20–29)
Creatinine, Ser: 0.91 mg/dL (ref 0.57–1.00)
GFR calc Af Amer: 92 mL/min/{1.73_m2} (ref >59)
GFR calc non Af Amer: 80 mL/min/{1.73_m2} (ref >59)
GLUCOSE: 95 mg/dL (ref 65–99)
POTASSIUM: 4 mmol/L (ref 3.5–5.2)
Sodium: 138 mmol/L (ref 134–144)

## 2018-02-25 LAB — HEMOGLOBIN A1C
ESTIMATED AVERAGE GLUCOSE: 111 mg/dL
HEMOGLOBIN A1C: 5.5 % (ref 4.8–5.6)

## 2018-03-31 ENCOUNTER — Ambulatory Visit (HOSPITAL_COMMUNITY)
Admission: EM | Admit: 2018-03-31 | Discharge: 2018-03-31 | Disposition: A | Discharge status: Home / Self Care | Attending: Physician Assistant | Admitting: Physician Assistant

## 2018-03-31 ENCOUNTER — Other Ambulatory Visit: Payer: Self-pay

## 2018-03-31 ENCOUNTER — Ambulatory Visit (INDEPENDENT_AMBULATORY_CARE_PROVIDER_SITE_OTHER)

## 2018-03-31 ENCOUNTER — Encounter (HOSPITAL_COMMUNITY): Payer: Self-pay | Admitting: *Deleted

## 2018-03-31 DIAGNOSIS — J40 Bronchitis, not specified as acute or chronic: Secondary | ICD-10-CM | POA: Diagnosis not present

## 2018-03-31 MED ORDER — IPRATROPIUM BROMIDE 0.06 % NA SOLN: 2.0000 | Freq: Four times a day (QID) | NASAL | 0 refills | Status: DC

## 2018-03-31 MED ORDER — PREDNISONE 50 MG PO TABS: 50.0000 mg | ORAL_TABLET | Freq: Every day | ORAL | 0 refills | Status: DC

## 2018-03-31 MED ORDER — BENZONATATE 100 MG PO CAPS: 100.0000 mg | ORAL_CAPSULE | Freq: Three times a day (TID) | ORAL | 0 refills | Status: DC

## 2018-03-31 MED ORDER — DOXYCYCLINE HYCLATE 100 MG PO CAPS: 100.0000 mg | ORAL_CAPSULE | Freq: Two times a day (BID) | ORAL | 0 refills | Status: DC

## 2018-03-31 MED ORDER — FLUCONAZOLE 150 MG PO TABS: 150.0000 mg | ORAL_TABLET | Freq: Every day | ORAL | 0 refills | Status: DC

## 2018-03-31 MED FILL — BENZONATATE 100 MG CAPS: 100 | 7 days supply | Qty: 21 | Fill #0

## 2018-03-31 MED FILL — IPRATROPIUM 0.06% SPRAY: 0.06 | 10 days supply | Qty: 15 | Fill #0

## 2018-03-31 MED FILL — predniSONE 50 MG TABS: 50 | 5 days supply | Qty: 5 | Fill #0

## 2018-03-31 NOTE — ED Triage Notes (Signed)
States she aspirated some lemonade on Thurs, started feeling bad late thurs c/o cough and left sided chest pain

## 2018-03-31 NOTE — ED Notes (Signed)
Called patient with instructions, questions answered.  Amy, pa spoke directly with patient

## 2018-03-31 NOTE — ED Provider Notes (Signed)
[facility]    CSN: [id number] Arrival date & time: 03/31/18  0800     History   Chief Complaint Chief Complaint  Patient presents with  . Cough    HPI Lolly MustacheJennifer A Buck is a 37 y.o. female.   37 year old comes in for URI symptoms. States had URI symptoms about 1-2 months ago, and although other symptoms improved, cough continued. Cough has now turned productive. She has also started having rhinorrhea and nasal congestion a few days ago. Denies fever, chills, night sweats. Has felt wheezing occasionally. No obvious shortness of breath. Has left sided back/flank pain occasionally with cough. Never smoker. History of bronchitis requiring inhaler use. otc cold medication occasionally.      Past Medical History:  Diagnosis Date  . Anemia   . Anemia, iron deficiency 04/05/2011  . Anxiety   . Arthritis   . Beta thalassemia major (HCC)    dx 10 years ago, Dr. Darnelle CatalanMagrinat  . Heart murmur   . Hypertension   . Kidney stones   . Mitral regurgitation due to cusp prolapse   . MVP (mitral valve prolapse)   . Thalassemia 04/05/2011    Patient Active Problem List   Diagnosis Date Noted  . Hypokalemia 02/15/2017  . Mitral regurgitation 12/24/2016  . Dizziness 07/17/2011  . Anemia, iron deficiency 04/05/2011  . Thalassemia 04/05/2011  . Hypertension   . Mitral valve prolapse   . Heart murmur     Past Surgical History:  Procedure Laterality Date  . KNEE SURGERY     left x 2  . OVARIAN CYST REMOVAL     rupture  . TEE WITHOUT CARDIOVERSION N/A 05/09/2017   Procedure: TRANSESOPHAGEAL ECHOCARDIOGRAM (TEE);  Surgeon: Elease HashimotoNahser, Deloris PingPhilip J, MD;  [facility];  Service: Cardiovascular;  Laterality: N/A;  . TONSILLECTOMY    . TUBAL LIGATION      OB History   No obstetric history on file.      Home Medications    Prior to Admission medications   Medication Sig Start Date End Date Taking? Authorizing Provider  benzonatate (TESSALON) 100 MG capsule Take 1  capsule (100 mg total) by mouth every 8 (eight) hours. 03/31/18   Cathie HoopsYu, Shariya Gaster V, PA-C  doxycycline (VIBRAMYCIN) 100 MG capsule Take 1 capsule (100 mg total) by mouth 2 (two) times daily. Fill 12/21 04/05/18   Belinda FisherYu, Reyes Fifield V, PA-C  etonogestrel-ethinyl estradiol (NUVARING) 0.12-0.015 MG/24HR vaginal ring Place 1 each vaginally every 28 (twenty-eight) days.  03/18/15   [provider]  fluconazole (DIFLUCAN) 150 MG tablet Take 1 tablet (150 mg total) by mouth daily. Take second dose 72 hours later if symptoms still persists. Fill 12/21 04/05/18   Cathie HoopsYu, Jaiveon Suppes V, PA-C  ipratropium (ATROVENT) 0.06 % nasal spray Place 2 sprays into both nostrils 4 (four) times daily. 03/31/18   Cathie HoopsYu, Montrae Braithwaite V, PA-C  potassium chloride SA (K-DUR,KLOR-CON) 20 MEQ tablet Take 40 mEq by mouth daily.    [provider]  predniSONE (DELTASONE) 50 MG tablet Take 1 tablet (50 mg total) by mouth daily. 03/31/18   Cathie HoopsYu, Adith Tejada V, PA-C  propranolol (INDERAL) 10 MG tablet Take 1 tablet (10 mg total) by mouth 4 (four) times daily as needed. 01/07/18   Nahser, Deloris PingPhilip J, MD    Family History Family History  Problem Relation Age of Onset  . Diabetes Father   . Hypertension Father   . Breast cancer Mother     Social History Social History   Tobacco  Use  . Smoking status: Never Smoker  . Smokeless tobacco: Never Used  Substance Use Topics  . Alcohol use: Yes    Alcohol/week: 4.0 standard drinks    Types: 4 Glasses of wine per week    Comment: 1-2 per week  . Drug use: No     Allergies   Other; Stadol [butorphanol tartrate]; Terbutaline; and Tributyl phosphate   Review of Systems Review of Systems  Reason unable to perform ROS: See HPI as above.     Physical Exam Triage Vital Signs ED Triage Vitals [03/31/18 0811]  Enc Vitals Group     BP 129/78     Pulse Rate 86     Resp 18     Temp 98.2 F (36.8 C)     Temp Source Oral     SpO2 99 %     Weight      Height      Head Circumference      Peak Flow      Pain  Score 0     Pain Loc      Pain Edu?      Excl. in GC?    No data found.  Updated Vital Signs BP 129/78 (BP Location: Right Arm)   Pulse 86   Temp 98.2 F (36.8 C) (Oral)   Resp 18   LMP 03/29/2018 (Exact Date)   SpO2 99%   Physical Exam Constitutional:      General: She is not in acute distress.    Appearance: She is well-developed.  HENT:     Head: Normocephalic and atraumatic.     Right Ear: Tympanic membrane, ear canal and external ear normal. Tympanic membrane is not erythematous or bulging.     Left Ear: Tympanic membrane, ear canal and external ear normal. Tympanic membrane is not erythematous or bulging.     Nose: Nose normal.     Right Sinus: No maxillary sinus tenderness or frontal sinus tenderness.     Left Sinus: No maxillary sinus tenderness or frontal sinus tenderness.     Mouth/Throat:     Pharynx: Uvula midline.  Eyes:     Conjunctiva/sclera: Conjunctivae normal.     Pupils: Pupils are equal, round, and reactive to light.  Neck:     Musculoskeletal: Normal range of motion and neck supple.  Cardiovascular:     Rate and Rhythm: Normal rate and regular rhythm.     Heart sounds: Normal heart sounds. No murmur. No friction rub. No gallop.   Pulmonary:     Effort: Pulmonary effort is normal. No respiratory distress.     Breath sounds: Normal breath sounds. No stridor. No decreased breath sounds, wheezing, rhonchi or rales.  Musculoskeletal:     Comments: No tenderness to palpation of left back/flank area.   Lymphadenopathy:     Cervical: No cervical adenopathy.  Skin:    General: Skin is warm and dry.  Neurological:     Mental Status: She is alert and oriented to person, place, and time.  Psychiatric:        Behavior: Behavior normal.        Judgment: Judgment normal.      UC Treatments / Results  Labs (all labs ordered are listed, but only abnormal results are displayed) Labs Reviewed - No data to display  EKG None  Radiology Dg Chest 2  View  Result Date: 03/31/2018 CLINICAL DATA:  Productive cough.  Left-sided chest pain. EXAM: CHEST - 2 VIEW COMPARISON:  11/04/2009 and 03/31/2007 FINDINGS: The heart size and mediastinal contours are within normal limits. Both lungs are clear except for minimal peribronchial thickening. The visualized skeletal structures are unremarkable. IMPRESSION: Minimal bronchitic changes. Electronically Signed   By: Francene Boyers M.D.   On: 03/31/2018 08:47    Procedures Procedures (including critical care time)  Medications Ordered in UC Medications - No data to display  Initial Impression / Assessment and Plan / UC Course  I have reviewed the triage vital signs and the nursing notes.  Pertinent labs & imaging results that were available during my care of the patient were reviewed by me and considered in my medical decision making (see chart for details).    Patient had to leave prior to CXR results due to family emergency. Will call patient with results and discharge instructions.  Discussed CXR results with patient. Prednisone as directed. Other symptomatic treatment discussed. Rx of Doxycycline filled, can take in 5 days if symptoms not improving. Patient requesting diflucan in case needing to take doxycycline. Return precautions given.  Final Clinical Impressions(s) / UC Diagnoses   Final diagnoses:  Bronchitis    ED Prescriptions    Medication Sig Dispense Auth. Provider   predniSONE (DELTASONE) 50 MG tablet Take 1 tablet (50 mg total) by mouth daily. 5 tablet Ransom Nickson V, PA-C   ipratropium (ATROVENT) 0.06 % nasal spray Place 2 sprays into both nostrils 4 (four) times daily. 15 mL Ohn Bostic V, PA-C   doxycycline (VIBRAMYCIN) 100 MG capsule Take 1 capsule (100 mg total) by mouth 2 (two) times daily. Fill 12/21 20 capsule Yurem Viner V, PA-C   benzonatate (TESSALON) 100 MG capsule Take 1 capsule (100 mg total) by mouth every 8 (eight) hours. 21 capsule Levaughn Puccinelli V, PA-C   fluconazole (DIFLUCAN)  150 MG tablet Take 1 tablet (150 mg total) by mouth daily. Take second dose 72 hours later if symptoms still persists. Fill 12/21 2 tablet Threasa Alpha, [place] 03/31/18 [id number]

## 2018-03-31 NOTE — Discharge Instructions (Signed)
Chest xray showed bronchitis changes. Acute bronchitis is usually caused by virus. Start prednisone for the symptoms. Start atrovent nasal spray for nasal congestion/drainage. You can use over the counter nasal saline rinse such as neti pot for nasal congestion. Keep hydrated, your urine should be clear to pale yellow in color. Tylenol/motrin for fever and pain. Monitor for any worsening of symptoms, chest pain, shortness of breath, wheezing, swelling of the throat, follow up for reevaluation.   If symptoms not improving after prednisone, can fill doxycycline on 12/21 for possible bacterial infection.

## 2018-04-18 ENCOUNTER — Telehealth: Payer: Self-pay | Admitting: Cardiovascular Disease

## 2018-04-18 NOTE — Telephone Encounter (Signed)
Called patient. She states that she just wanted a copy of her last lab results but she was able to log into MyChart and see them. Nothing else needed at this time.

## 2018-04-18 NOTE — Telephone Encounter (Signed)
New Message   Pt would like lab results emailed to her

## 2018-07-15 ENCOUNTER — Telehealth: Payer: Self-pay | Admitting: Cardiovascular Disease

## 2018-07-15 NOTE — Telephone Encounter (Signed)
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 Requesting call from nurse

## 2018-07-15 NOTE — Telephone Encounter (Signed)
Spoke with patient who states she is going to help with Covid crisis in [place] and wanted to verify that her mitral valve prolapse does not put her at higher risk for infection. I advised that per Dr. Elease Hashimoto, there is no data that indicates she is at higher risk for contracting Covid 19. Patient verbalized understanding and thanked me for returning her call.

## 2018-10-21 ENCOUNTER — Telehealth: Payer: Self-pay | Admitting: Nurse Practitioner

## 2018-10-21 NOTE — Telephone Encounter (Signed)
Left message for patient requesting that we move her virtual ov to a day when Dr. Acie Fredrickson will not be in the office (a quarter clinic day)

## 2018-10-24 ENCOUNTER — Telehealth: Payer: Self-pay | Admitting: Cardiovascular Disease

## 2018-10-24 NOTE — Telephone Encounter (Signed)

## 2018-10-27 ENCOUNTER — Telehealth: Admitting: Cardiovascular Disease

## 2018-10-29 NOTE — Progress Notes (Signed)
Virtual Visit via Telephone Note   This visit type was conducted due to national recommendations for restrictions regarding the COVID-19 Pandemic (e.g. social distancing) in an effort to limit this patient's exposure and mitigate transmission in our community.  Due to her co-morbid illnesses, this patient is at least at moderate risk for complications without adequate follow up.  This format is felt to be most appropriate for this patient at this time.  The patient did not have access to video technology/had technical difficulties with video requiring transitioning to audio format only (telephone).  All issues noted in this document were discussed and addressed.  No physical exam could be performed with this format.  Please refer to the patient's chart for her  consent to telehealth for [facility].   Date:  10/29/2018   ID:  Andrea [name], DOB 1980-07-31, MRN [id number]  Patient Location: Home Provider Location: Office  PCP:  Vicenta Aly, [place]  Cardiologist:  Mertie Moores, MD  Electrophysiologist:  None   1. Mitral Valve Prolapse 2. Dizziness 3. Beta Thalassemia- iron deficient anemia.   Andrea [name] is a 38 y.o. with MVP.  She presented 2 months ago  with symptoms of dizziness.  She has these episodes at various times. She had one severe episode while driving. It was described as a very slow motion sensation that the room was spinning. She also these episodes while standing and sitting.   She has the sensation that she spinning.  These episodes started in November or December but not particularly worsened but because of their persistence she presents today for further evaluation. They're not necessarily related to turning of her head. They're not orthostatic in nature   These episodes of dizziness have resolved. She is overall doing quite well.  Sept. 10, 2018:  Andrea [name] is seen today after a 6 year absence. Had moved to [facility], now back at [place] Had a recent  episode of palpitations - felt like she was being kicked in the chest Lasted off and on for 3 weeks.  Went to the ER Potassium was found to be 3.2  Was given potassium replacement but did not get an ongoing script.  Has been exercising some   Sept. 24, 2019: Andrea [name] is seen today as a work in visit Has not tolerated the metoprolol  Had a TEE in Jan.  24, 2019: Was found to have moderate mitral valve prolapse with moderate MR There was no reversal of flow in the pulmonary veins.  About 4 weeks ago, she started feeling a quivering in her heart.  Feels that her HR is irregular. Associated with CP and anxiety .   Did not tolerate the metoprolol - palpitations seemed to have worsened and she gained weight   Will last for hours ,  HR on Apple watch will go to 137. Is in NP school.   [place] . Thinks that some of this could be due to anxiety  ( her step mother died recently )  Lives in [place],   Is not exercise ,  She is frightened to exercise . Has not had blood work recently   February 24, 2018: Andrea [name] seen today for follow-up of her palpitations and mitral valve prolapse. An echocardiogram today shows normal left ventricular systolic function with an ejection fraction of 60 to 65%.  Diastolic parameters were normal.  She has severe prolapse of a small segment of the posterior leaflet.  She has mitral regurgitation which appears to be mild.  Her event monitor from September reveals normal sinus rhythm with episodes of sinus tachycardia.  She has frequent premature ventricular contractions with occasional episodes of bigeminy.  Just takes propranolol as needed.  Is walking  2 miles a day several days a week  Walks lots at work  Avoids salt Has not had any severe episodes os SVT .    Glucose was slightly elevated at last visit Will recheck and check HbA1C     Evaluation Performed:  Follow-Up Visit  Chief Complaint:  MVP   October 30, 2018     Lolly MustacheJennifer A [name] is a 38 y.o. 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   Hospitals are full, ICUs are full .   She is hoping they close the beach.  She has occasionally anxiety and then has tachycardia. She had been on metoprolol and we stopped due to weight gain  She has restarted metoprolol 25 mg daily.     The patient does not have symptoms concerning for COVID-19 infection (fever, chills, cough, or new shortness of breath).    Past Medical History:  Diagnosis Date  . Anemia   . Anemia, iron deficiency 04/05/2011  . Anxiety   . Arthritis   . Beta thalassemia major (HCC)    dx 10 years ago, Dr. Darnelle CatalanMagrinat  . Heart murmur   . Hypertension   . Kidney stones   . Mitral regurgitation due to cusp prolapse   . MVP (mitral valve prolapse)   . Thalassemia 04/05/2011   Past Surgical History:  Procedure Laterality Date  . KNEE SURGERY     left x 2  . OVARIAN CYST REMOVAL     rupture  . TEE WITHOUT CARDIOVERSION N/A 05/09/2017   Procedure: TRANSESOPHAGEAL ECHOCARDIOGRAM (TEE);  Surgeon: Elease HashimotoNahser, Deloris PingPhilip J, MD;  [facility];  Service: Cardiovascular;  Laterality: N/A;  . TONSILLECTOMY    . TUBAL LIGATION       No outpatient medications have been marked as taking for the 10/30/18 encounter (Appointment) with Andrea [name], Deloris PingPhilip J, MD.     Allergies:   Other, Stadol [butorphanol tartrate], Terbutaline, and Tributyl phosphate   Social History   Tobacco Use  . Smoking status: Never Smoker  . Smokeless tobacco: Never Used  Substance Use Topics  . Alcohol use: Yes    Alcohol/week: 4.0 standard drinks    Types: 4 Glasses of wine per week    Comment: 1-2 per week  . Drug use: No     Family Hx: The patient's family history includes Breast cancer in her mother; Diabetes in her father; Hypertension in her father.  ROS:   Please see the history of present illness.     All other systems reviewed and are negative.   Prior CV  studies:   The following studies were reviewed today:    Labs/Other Tests and Data Reviewed:    EKG:  No ECG reviewed.  Recent Labs: 01/07/2018: Hemoglobin 9.3; Magnesium 1.9; Platelets 268; TSH 2.020 02/24/2018: BUN 8; Creatinine, Ser 0.91; Potassium 4.0; Sodium 138   Recent Lipid Panel No results found for: CHOL, TRIG, HDL, CHOLHDL, LDLCALC, LDLDIRECT  Wt Readings from Last 3 Encounters:  02/24/18 187 lb (84.8 kg)  01/07/18 188 lb 12.8 oz (85.6 kg)  05/09/17 171 lb (77.6 kg)     Objective:    Vital Signs:  There were no vitals taken for this visit.     ASSESSMENT & PLAN:  1. Sinus Tach:   She continues to have episodes of sinus tachycardia versus supraventricular tachycardia.  She restarted her metoprolol.  She had stopped that originally because of some weight gain.  We will change the metoprolol to Toprol-XL 25 mg a day.  We will also refill her propranolol which she takes as needed.  We will also refill her potassium pill  2.  Mitral valve prolapse: She has mild to moderate mitral valve prolapse.  We performed a transesophageal echo a year or so ago.  This seems to be stable.  I will see her in 6 months for a follow-up office visit.  COVID-19 Education: The signs and symptoms of COVID-19 were discussed with the patient and how to seek care for testing (follow up with PCP or arrange E-visit).  The importance of social distancing was discussed today.  Time:   Today, I have spent  18 minutes with the patient with telehealth technology discussing the above problems.     Medication Adjustments/Labs and Tests Ordered: Current medicines are reviewed at length with the patient today.  Concerns regarding medicines are outlined above.   Tests Ordered: No orders of the defined types were placed in this encounter.   Medication Changes: No orders of the defined types were placed in this encounter.   Follow Up:  In Person in 6 month(s)  Signed, Kristeen MissPhilip Alcee Sipos, MD   10/29/2018 6:46 PM    [facility]

## 2018-10-30 ENCOUNTER — Encounter: Payer: Self-pay | Admitting: Cardiovascular Disease

## 2018-10-30 ENCOUNTER — Telehealth (INDEPENDENT_AMBULATORY_CARE_PROVIDER_SITE_OTHER): Payer: Self-pay | Admitting: Cardiovascular Disease

## 2018-10-30 ENCOUNTER — Other Ambulatory Visit: Payer: Self-pay

## 2018-10-30 VITALS — BP 108/73 | HR 93 | Ht 65.0 in | Wt 175.0 lb

## 2018-10-30 DIAGNOSIS — I341 Nonrheumatic mitral (valve) prolapse: Secondary | ICD-10-CM

## 2018-10-30 DIAGNOSIS — R42 Dizziness and giddiness: Secondary | ICD-10-CM

## 2018-10-30 DIAGNOSIS — Z7189 Other specified counseling: Secondary | ICD-10-CM

## 2018-10-30 DIAGNOSIS — R000 Tachycardia, unspecified: Secondary | ICD-10-CM

## 2018-10-30 MED ORDER — METOPROLOL SUCCINATE ER 25 MG PO TB24: 25.0000 mg | ORAL_TABLET | Freq: Every day | ORAL | 3 refills | Status: DC

## 2018-10-30 MED ORDER — PROPRANOLOL HCL 10 MG PO TABS: 10.0000 mg | ORAL_TABLET | Freq: Four times a day (QID) | ORAL | 3 refills | Status: AC | PRN

## 2018-10-30 MED ORDER — POTASSIUM CHLORIDE CRYS ER 20 MEQ PO TBCR: 40.0000 meq | EXTENDED_RELEASE_TABLET | Freq: Every day | ORAL | 3 refills | Status: AC

## 2018-10-30 NOTE — Patient Instructions (Signed)
Medication Instructions:   Your physician has recommended you make the following change in your medication:  Stop Metoprolol Tartrate 25MG   Start Metoprolol Succinate 25MG , 1 tablet by mouth once a day  If you need a refill on your cardiac medications before your next appointment, please call your pharmacy.   Lab work:  None ordered  If you have labs (blood work) drawn today and your tests are completely normal, you will receive your results only by: Marland Kitchen MyChart Message (if you have MyChart) OR . A paper copy in the mail If you have any lab test that is abnormal or we need to change your treatment, we will call you to review the results.  Testing/Procedures:  None ordered today  Follow-Up:  At [facility], you and your health needs are our priority.  As part of our continuing mission to provide you with exceptional heart care, we have created designated Provider Care Teams.  These Care Teams include your primary Cardiologist (physician) and Advanced Practice Providers (APPs -  Physician Assistants and Nurse Practitioners) who all work together to provide you with the care you need, when you need it. You will need a follow up appointment in:  6 months.  Please call our office 2 months in advance to schedule this appointment.  You may see Mertie Moores, MD or one of the following Advanced Practice Providers on your designated Care Team: Richardson Dopp, PA-C [place], [place] . Daune Perch, NP

## 2018-12-02 ENCOUNTER — Telehealth: Payer: Self-pay | Admitting: Cardiovascular Disease

## 2018-12-02 NOTE — Telephone Encounter (Signed)
Pt is calling to inform Dr. Acie Fredrickson that she went to her PCP today and had an in-office EKG done for upcoming surgery.  Pt states her PCP said her EKG looked good, but in the interpretation comments it stated left atrial enlargement.  Pt states her PCP informed her this was fine, and more than likely is related to her MVP.  Pt is ok with this information, but she would like to hear from Dr. Acie Fredrickson that her left atrial enlargement is due in part to her MVP.  Pt states she would feel more comfortable hearing this from her Cardiologist of 20 years.  Noted EKG is not viewable in her chart, so I informed her of this, and she stated she doesn't need him to view the EKG, she just wants reassurance with the comment made about "left atrial enlargement." Informed the pt that I will route this message to Dr. Acie Fredrickson to review and further advise on, and someone from the office will follow-up with her thereafter.  Pt verbalized understanding and agrees with this plan/

## 2018-12-02 NOTE — Telephone Encounter (Signed)
This is related to her MVP. Similar to her echo results.  I have called and discussed this with patient

## 2018-12-02 NOTE — Telephone Encounter (Signed)
New message     Pt says she had an EKG done today and she would like for Dr Acie Fredrickson to call her and go over it with her    Please call

## 2019-04-14 IMAGING — DX DG CHEST 2V
2 series · 2 of 2 positions shown · non-contrast
Comparison: 11/04/2009 and 03/31/2007

CLINICAL DATA: Productive cough.  Left-sided chest pain.

EXAM:
CHEST - 2 VIEW

[chest pa]
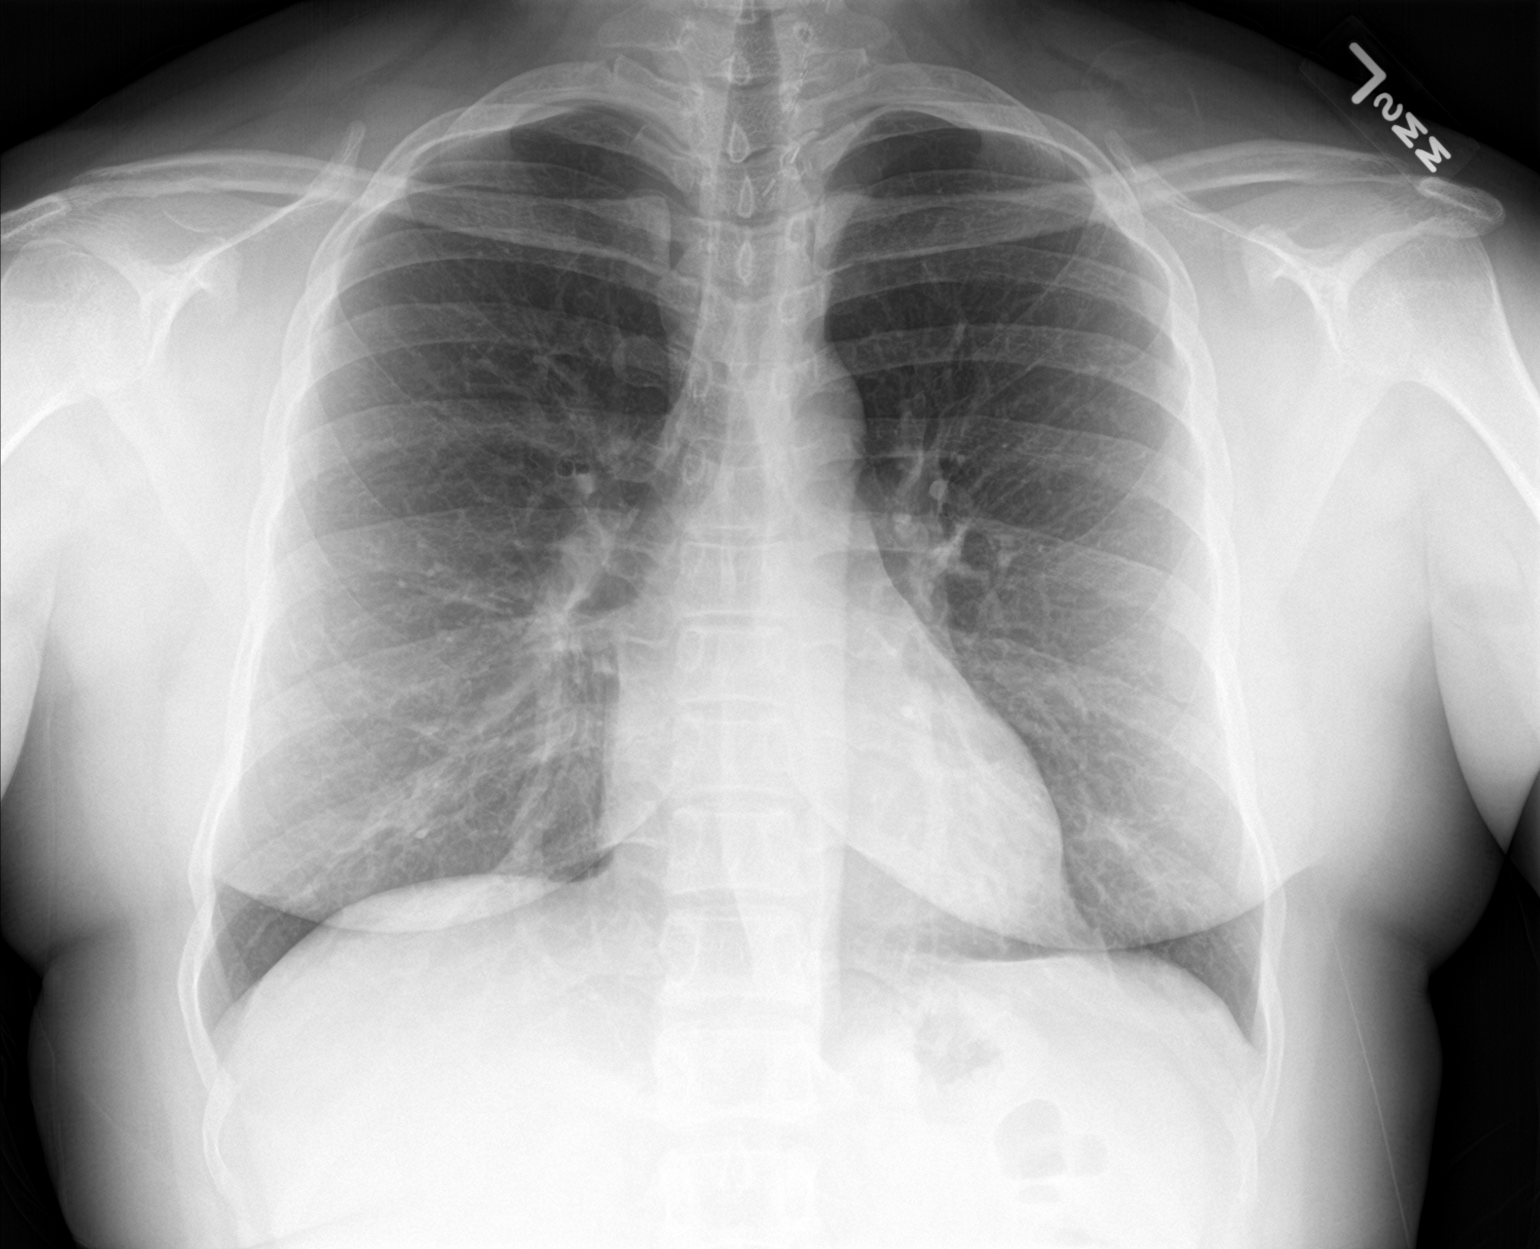

[chest lat]
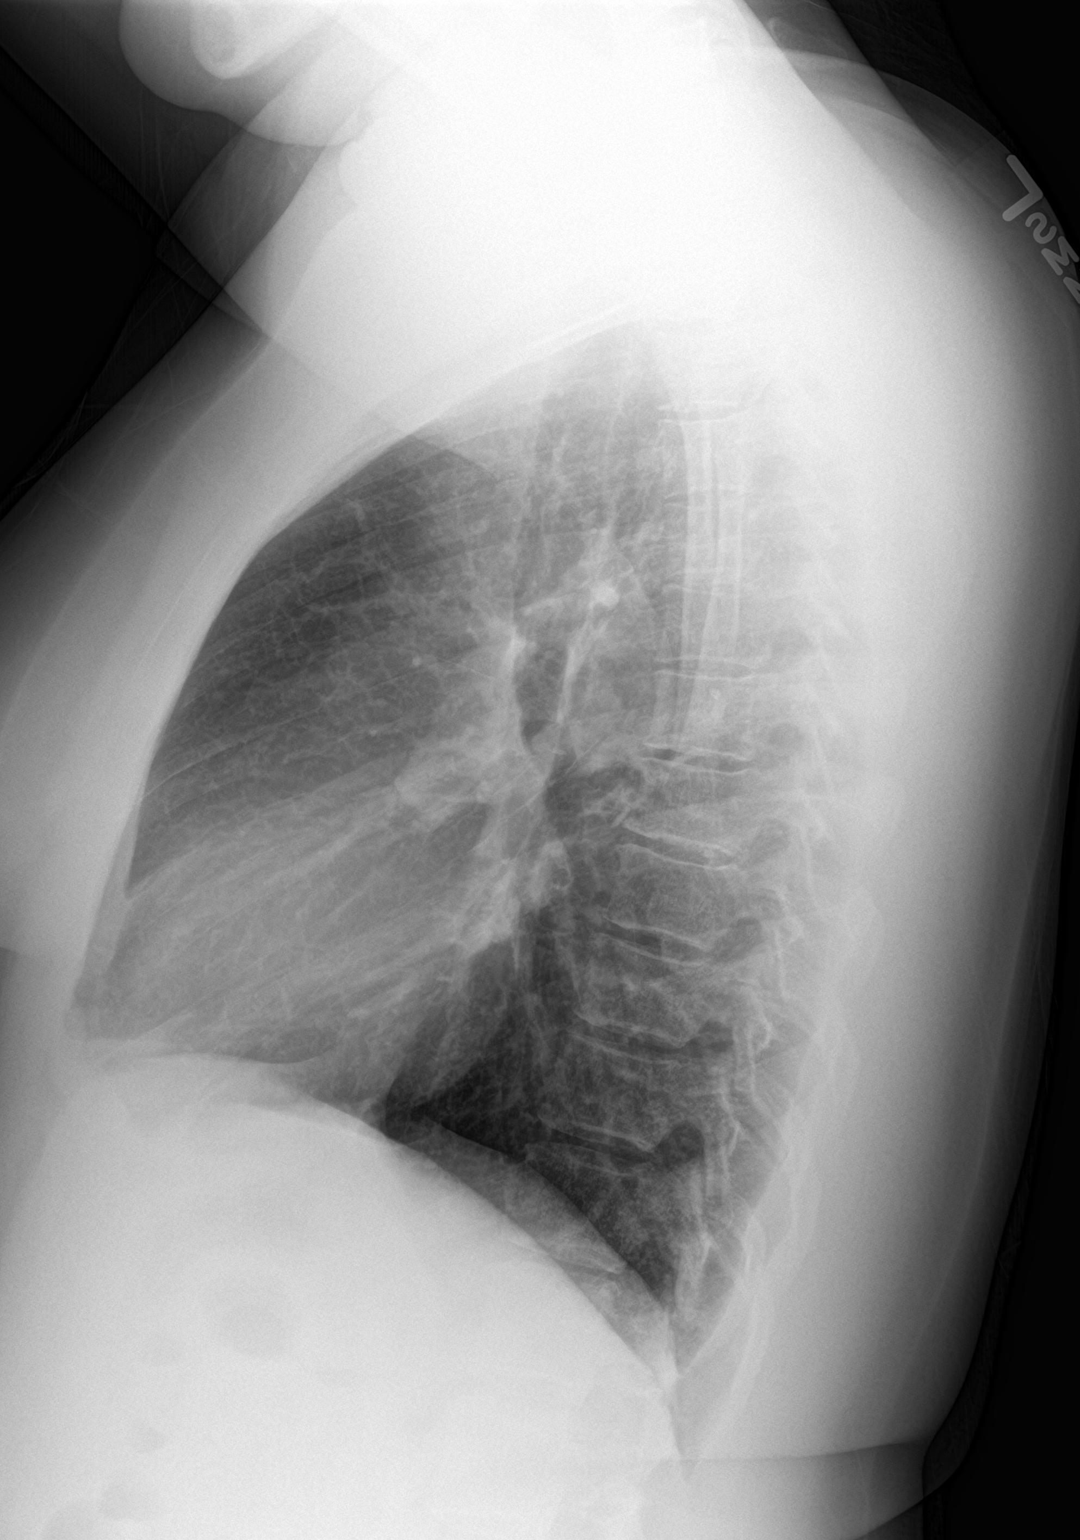

[2 of 2 positions shown; findings below may reference images not displayed]

FINDINGS: The heart size and mediastinal contours are within normal limits.
Both lungs are clear except for minimal peribronchial thickening.
The visualized skeletal structures are unremarkable.
IMPRESSION: Minimal bronchitic changes.

## 2020-03-23 ENCOUNTER — Telehealth: Payer: Self-pay | Admitting: Cardiovascular Disease

## 2020-03-23 NOTE — Telephone Encounter (Signed)
Patient states she usually gets an echo yearly and would like to know if she needs to have one done.

## 2020-03-24 NOTE — Telephone Encounter (Signed)
OK to order echo and then follow up visit with me a week or so later.

## 2020-03-25 NOTE — Telephone Encounter (Signed)
Patient states that she typically gets an Echo every year and called to see if Dr. Elease Hashimoto would like her to get one, Dr. Elease Hashimoto agreeable to order Echo and f/u appointment.   Patient already has an appointment on 03/28/20 with Dr. Elease Hashimoto. Offered to cancel this appointment, order Echo and reschedule a follow-up appointment after Echo has been completed. Patient states that she is coming from out of town and does not want to change her upcoming appointment, states she will discuss need for Echo with Dr. Elease Hashimoto in OV on Monday.   No Echocardiogram ordered at this time.

## 2020-03-27 ENCOUNTER — Encounter: Payer: Self-pay | Admitting: Cardiovascular Disease

## 2020-03-27 NOTE — Progress Notes (Signed)
This encounter was created in error - please disregard.

## 2020-03-28 ENCOUNTER — Encounter: Payer: Self-pay | Admitting: Cardiovascular Disease

## 2020-12-15 ENCOUNTER — Other Ambulatory Visit: Payer: Self-pay | Admitting: *Deleted

## 2020-12-15 MED ORDER — METOPROLOL SUCCINATE ER 25 MG PO TB24: 25.0000 mg | ORAL_TABLET | Freq: Every day | ORAL | 0 refills | Status: DC

## 2021-07-10 ENCOUNTER — Telehealth: Payer: Self-pay | Admitting: Cardiovascular Disease

## 2021-07-10 DIAGNOSIS — I341 Nonrheumatic mitral (valve) prolapse: Secondary | ICD-10-CM

## 2021-07-10 NOTE — Telephone Encounter (Signed)
Pt called in and made an appt for 4/28 with Dr Melburn Popper.  She would like for him to put a order for echo in so she can get it done at the same time.   ? ?Best number [phone] ? ?

## 2021-07-14 NOTE — Telephone Encounter (Signed)
Patient has echo scheduled same day as next ov 08/11/21.  ?

## 2021-08-11 ENCOUNTER — Telehealth (HOSPITAL_COMMUNITY): Payer: Self-pay | Admitting: Cardiovascular Disease

## 2021-08-11 ENCOUNTER — Ambulatory Visit: Payer: Self-pay | Admitting: Cardiovascular Disease

## 2021-08-11 ENCOUNTER — Ambulatory Visit (HOSPITAL_COMMUNITY): Payer: Self-pay

## 2021-08-11 NOTE — Telephone Encounter (Signed)
Patient called and cancelled same day appointment for echocardiogram and will call back to reschedule. Order will be removed from the echo wq and when patient calls back to rescheduled we will reinstate the order. Thank you ?

## 2022-05-02 ENCOUNTER — Other Ambulatory Visit: Payer: Self-pay

## 2022-05-02 MED ORDER — METOPROLOL SUCCINATE ER 25 MG PO TB24: 25.0000 mg | ORAL_TABLET | Freq: Every day | ORAL | 0 refills | Status: AC

## 2022-05-02 NOTE — Telephone Encounter (Signed)
Pt's medication was sent to pt's pharmacy as requested. Confirmation received.

## 2022-05-07 NOTE — Progress Notes (Signed)
This encounter was created in error - please disregard.

## 2022-05-09 ENCOUNTER — Ambulatory Visit: Payer: Self-pay | Admitting: Cardiovascular Disease

## 2022-06-18 ENCOUNTER — Encounter: Payer: Self-pay | Admitting: Cardiovascular Disease

## 2022-06-18 NOTE — Progress Notes (Signed)
This encounter was created in error - please disregard.

## 2022-06-19 ENCOUNTER — Encounter: Payer: Self-pay | Admitting: Cardiovascular Disease
